# Patient Record
Sex: Female | Born: 1995
Health system: Southern US, Community
[De-identification: ages and names within clinical notes are randomized; demographics above are authoritative.]

## PROBLEM LIST (undated history)

## (undated) DIAGNOSIS — F32A Depression, unspecified: Secondary | ICD-10-CM

## (undated) DIAGNOSIS — F419 Anxiety disorder, unspecified: Secondary | ICD-10-CM

## (undated) HISTORY — PX: DENTAL SURGERY: SHX609

---
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
## 2016-03-26 DIAGNOSIS — Z23 Encounter for immunization: Secondary | ICD-10-CM | POA: Diagnosis not present

## 2016-05-11 DIAGNOSIS — F4323 Adjustment disorder with mixed anxiety and depressed mood: Secondary | ICD-10-CM | POA: Diagnosis not present

## 2016-05-23 DIAGNOSIS — F4323 Adjustment disorder with mixed anxiety and depressed mood: Secondary | ICD-10-CM | POA: Diagnosis not present

## 2016-06-26 DIAGNOSIS — K080 Exfoliation of teeth due to systemic causes: Secondary | ICD-10-CM | POA: Diagnosis not present

## 2016-07-19 DIAGNOSIS — F4323 Adjustment disorder with mixed anxiety and depressed mood: Secondary | ICD-10-CM | POA: Diagnosis not present

## 2016-07-27 DIAGNOSIS — F4323 Adjustment disorder with mixed anxiety and depressed mood: Secondary | ICD-10-CM | POA: Diagnosis not present

## 2016-08-10 DIAGNOSIS — F4323 Adjustment disorder with mixed anxiety and depressed mood: Secondary | ICD-10-CM | POA: Diagnosis not present

## 2016-08-24 DIAGNOSIS — F4323 Adjustment disorder with mixed anxiety and depressed mood: Secondary | ICD-10-CM | POA: Diagnosis not present

## 2016-09-05 ENCOUNTER — Ambulatory Visit (INDEPENDENT_AMBULATORY_CARE_PROVIDER_SITE_OTHER): Payer: Federal, State, Local not specified - PPO | Admitting: Family Medicine

## 2016-09-05 VITALS — BP 118/70 | HR 92 | Temp 98.3°F | Resp 18 | Ht 64.5 in | Wt 120.2 lb

## 2016-09-05 DIAGNOSIS — J029 Acute pharyngitis, unspecified: Secondary | ICD-10-CM

## 2016-09-05 LAB — POCT RAPID STREP A (OFFICE): Rapid Strep A Screen: NEGATIVE

## 2016-09-05 MED ORDER — PENICILLIN V POTASSIUM 500 MG PO TABS: 500.0000 mg | ORAL_TABLET | Freq: Two times a day (BID) | ORAL | 0 refills | Status: AC

## 2016-09-05 NOTE — Patient Instructions (Addendum)
   Strep Throat Strep throat is an infection of the throat. It is caused by germs. Strep throat spreads from person to person because of coughing, sneezing, or close contact. Follow these instructions at home: Medicines   Take over-the-counter and prescription medicines only as told by your doctor.  Take your antibiotic medicine as told by your doctor. Do not stop taking the medicine even if you feel better.  Have family members who also have a sore throat or fever go to a doctor. Eating and drinking   Do not share food, drinking cups, or personal items.  Try eating soft foods until your sore throat feels better.  Drink enough fluid to keep your pee (urine) clear or pale yellow. General instructions   Rinse your mouth (gargle) with a salt-water mixture 3-4 times per day or as needed. To make a salt-water mixture, stir -1 tsp of salt into 1 cup of warm water.  Make sure that all people in your house wash their hands well.  Rest.  Stay home from school or work until you have been taking antibiotics for 24 hours.  Keep all follow-up visits as told by your doctor. This is important. Contact a doctor if:  Your neck keeps getting bigger.  You get a rash, cough, or earache.  You cough up thick liquid that is green, yellow-brown, or bloody.  You have pain that does not get better with medicine.  Your problems get worse instead of getting better.  You have a fever. Get help right away if:  You throw up (vomit).  You get a very bad headache.  You neck hurts or it feels stiff.  You have chest pain or you are short of breath.  You have drooling, very bad throat pain, or changes in your voice.  Your neck is swollen or the skin gets red and tender.  Your mouth is dry or you are peeing less than normal.  You keep feeling more tired or it is hard to wake up.  Your joints are red or they hurt. This information is not intended to replace advice given to you by your health  care provider. Make sure you discuss any questions you have with your health care provider. Document Released: 11/28/2007 Document Revised: 02/08/2016 Document Reviewed: 10/04/2014 Elsevier Interactive Patient Education  2017 Elsevier Inc.    IF you received an x-ray today, you will receive an invoice from [facility]. Please contact [facility] at [phone] with questions or concerns regarding your invoice.   IF you received labwork today, you will receive an invoice from [facility]. Please contact [facility] at [phone] with questions or concerns regarding your invoice.   Our billing staff will not be able to assist you with questions regarding bills from these companies.  You will be contacted with the lab results as soon as they are available. The fastest way to get your results is to activate your My Chart account. Instructions are located on the last page of this paperwork. If you have not heard from us regarding the results in 2 weeks, please contact this office.     

## 2016-09-05 NOTE — Progress Notes (Signed)
   Laura Lambert is a 21 y.o. female who presents to Primary Care at [facility] today for:  1. Sore throat. Patient presents to clinic today with complaints of sore throat. States that sore throat started 2 days ago.Worse today. States that she looked at her throat noticed redness and white spots in the back of her throat. Also endorsing some associated swollen lymph nodes. Denies any sick contacts with strep throat. Sister about 4 months ago was diagnosed with mono. Has had strep in the past. Denies cough, fever, fatigue, nausea and vomiting.  ROS as above.  Pertinently, no chest pain, palpitations, SOB, Fever, Chills, Abd pain, N/V/D.   PMH reviewed. Patient is a nonsmoker.   No past medical history on file. No past surgical history on file.  Medications reviewed. Current Outpatient Prescriptions  Medication Sig Dispense Refill  . b complex vitamins tablet Take 1 tablet by mouth daily.    . Lactobacillus (ACIDOPHILUS PO) Take by mouth.    . norethindrone-ethinyl estradiol (FEMHRT LOW DOSE) 0.5-2.5 MG-MCG per tablet Take 1 tablet by mouth daily.     No current facility-administered medications for this visit.      Physical Exam:  BP 118/70 (BP Location: Right Arm, Patient Position: Sitting, Cuff Size: Small)   Pulse 92   Temp 98.3 F (36.8 C) (Oral)   Resp 18   Ht 5' 4.5" (1.638 m)   Wt 120 lb 3.2 oz (54.5 kg)   LMP 08/22/2016   SpO2 96%   BMI 20.31 kg/m  Gen:  Alert, cooperative patient who appears stated age in no acute distress.  Vital signs reviewed. HEENT: EOMI,  MMM, o/p with erythema. Has tonsillar exudate. Anterior cervical adenopathy appreciated bilaterally.  Pulm:  Clear to auscultation bilaterally with good air movement.  No wheezes or rales noted.   Cardiac:  Regular rate and rhythm without murmur auscultated.  Good S1/S2. Psych: mood and affect are appropriate  Results for orders placed or performed in visit on 09/05/16  POCT rapid strep A  Result Value  Ref Range   Rapid Strep A Screen Negative Negative     Assessment and Plan:  1. Sore throat Well-appearing. Centor score 3. Even though rapid strep negative will treat with penicllin due to high likelihood of strep based off symptoms. Rapid swab and can be false negative. Will send strep swab for culture. Possibility of mononucleosis in patient. Counseled patient on returning if worsening of symptoms and discontinuing medication if rash. Handout given.  - POCT rapid strep A   [name], DO 09/05/2016, 11:48 AM PGY-3, [facility]

## 2016-09-07 ENCOUNTER — Telehealth: Payer: Self-pay | Admitting: *Deleted

## 2016-09-07 DIAGNOSIS — J029 Acute pharyngitis, unspecified: Secondary | ICD-10-CM | POA: Diagnosis not present

## 2016-09-07 DIAGNOSIS — J069 Acute upper respiratory infection, unspecified: Secondary | ICD-10-CM | POA: Diagnosis not present

## 2016-09-07 NOTE — Telephone Encounter (Signed)
PATIENT STATES SHE SAW DR. Gwendolyn GrantWALDEN ON Wednesday FOR A SORE THROAT. HE SENT HER STREP CULTURE OFF TO THE LAB. SHE WOULD LIKE  TO GET THE RESULTS PLEASE. BEST PHONE [phone] (CELL) PHARMACY CHOICE IS [facility] [address] IN [place]. MBC

## 2016-09-07 NOTE — Telephone Encounter (Signed)
Spoke with patient. Advised that preliminary results show strep, not group A, and the specimen has been re-incubated for better growth. She is on PCN. She reports that her throat symptoms are worsening. Advised to RTC for re-evaluation today. She states that she will make arrangements with her PCP.

## 2016-09-08 LAB — CULTURE, GROUP A STREP

## 2016-10-02 DIAGNOSIS — F4323 Adjustment disorder with mixed anxiety and depressed mood: Secondary | ICD-10-CM | POA: Diagnosis not present

## 2016-10-19 DIAGNOSIS — F4323 Adjustment disorder with mixed anxiety and depressed mood: Secondary | ICD-10-CM | POA: Diagnosis not present

## 2016-11-01 DIAGNOSIS — F4323 Adjustment disorder with mixed anxiety and depressed mood: Secondary | ICD-10-CM | POA: Diagnosis not present

## 2017-03-08 DIAGNOSIS — R634 Abnormal weight loss: Secondary | ICD-10-CM | POA: Diagnosis not present

## 2017-03-12 DIAGNOSIS — R634 Abnormal weight loss: Secondary | ICD-10-CM | POA: Diagnosis not present

## 2017-03-12 DIAGNOSIS — R6881 Early satiety: Secondary | ICD-10-CM | POA: Diagnosis not present

## 2017-03-14 DIAGNOSIS — F422 Mixed obsessional thoughts and acts: Secondary | ICD-10-CM | POA: Diagnosis not present

## 2017-03-14 DIAGNOSIS — F411 Generalized anxiety disorder: Secondary | ICD-10-CM | POA: Diagnosis not present

## 2017-03-18 DIAGNOSIS — R6881 Early satiety: Secondary | ICD-10-CM | POA: Diagnosis not present

## 2017-03-18 DIAGNOSIS — K2930 Chronic superficial gastritis without bleeding: Secondary | ICD-10-CM | POA: Diagnosis not present

## 2017-03-18 DIAGNOSIS — F411 Generalized anxiety disorder: Secondary | ICD-10-CM | POA: Diagnosis not present

## 2017-03-18 DIAGNOSIS — R634 Abnormal weight loss: Secondary | ICD-10-CM | POA: Diagnosis not present

## 2017-03-22 ENCOUNTER — Encounter (HOSPITAL_COMMUNITY): Payer: Self-pay | Admitting: Emergency Medicine

## 2017-03-22 ENCOUNTER — Ambulatory Visit (HOSPITAL_COMMUNITY)
Admission: RE | Admit: 2017-03-22 | Discharge: 2017-03-22 | Disposition: A | Discharge status: Home / Self Care | Payer: Federal, State, Local not specified - PPO | Attending: Psychiatry | Admitting: Psychiatry

## 2017-03-22 DIAGNOSIS — F331 Major depressive disorder, recurrent, moderate: Secondary | ICD-10-CM | POA: Diagnosis not present

## 2017-03-22 NOTE — H&P (Signed)
Behavioral Health Medical Screening Exam  Laura Lambert is an 21 y.o. female.  Total Time spent with patient: 45 minutes  Psychiatric Specialty Exam: Physical Exam  Constitutional: She is oriented to person, place, and time.  Neck: Normal range of motion.  Respiratory: Effort normal and breath sounds normal.  Musculoskeletal: Normal range of motion.  Neurological: She is alert and oriented to person, place, and time.  Skin: Skin is warm and dry.  Psychiatric: Her speech is normal and behavior is normal. Judgment and thought content normal. Her mood appears anxious. Cognition and memory are normal. She exhibits a depressed mood.    Review of Systems  Constitutional: Negative.   HENT: Negative.   Eyes: Negative.   Respiratory: Negative.   Cardiovascular: Negative.   Gastrointestinal: Positive for abdominal pain, nausea and vomiting.  Genitourinary: Negative.   Musculoskeletal: Negative.   Skin: Negative.   Neurological: Negative.   Endo/Heme/Allergies: Negative.   Psychiatric/Behavioral: Positive for depression (Stable) and substance abuse (THC; stopped 3 days ago). Negative for hallucinations and suicidal ideas. The patient is nervous/anxious (Stable).     Blood pressure 110/66, pulse 80, temperature 99.1 F (37.3 C), temperature source Oral, resp. rate 16, SpO2 100 %.There is no height or weight on file to calculate BMI.  General Appearance: Casual  Eye Contact:  Minimal  Speech:  Clear and Coherent  Volume:  Normal  Mood:  Anxious and Depressed  Affect:  Depressed and Tearful  Thought Process:  Coherent  Orientation:  Full (Time, Place, and Person)  Thought Content:  Denies hallucinations, delusions, and paranoia  Suicidal Thoughts:  No  Homicidal Thoughts:  No  Memory:  Immediate;   Good Recent;   Good Remote;   Good  Judgement:  Fair  Insight:  Fair  Psychomotor Activity:  Normal  Concentration: Concentration: Fair and Attention Span: Fair  Recall:  Good   Fund of Knowledge:Fair  Language: Good  Akathisia:  No  Handed:  Right  AIMS (if indicated):     Assets:  Communication Skills Desire for Improvement Housing Physical Health Resilience Social Support Transportation  Sleep:       Musculoskeletal: Strength & Muscle Tone: within normal limits Gait & Station: normal Patient leans: N/A   Assessment:  Laura Lambert, 21 y.o., female patient presents as walk in at [facility] with complaints of worsening depression, not eating, sleeping related to withdrawal from [place].  Patient seen by this provider and consulted with Dr. Lucianne Muss.  On evaluation Laura Lambert reports that she has been having difficulty eating and recently diagnosed with cyclic vomiting syndrome.  States that she stopped smoking "weed" 3 days ago and since she stopped she has not been able to eat or sleep.  Has seen a psychiatrist who prescribed medication to increase appetite but has not taken it.  Was on Zoloft but stopped related to thinking caused headache; was on Prozac prior and no adverse reaction.   Prozac prescribed by PCP informed to restart Prozac and talk with PCP about Trazodone 50 mg Q hs prn for insomnia and Vistaril 25 mg tid prn for anxiety.  Also discussed the partial hospitalization.  Parents at side and all agree with plan to speak with PCP and partial hospitalization program.   Patient denies suicidal/homicidal ideation, psychosis, and paranoia.   Blood pressure 110/66, pulse 80, temperature 99.1 F (37.3 C), temperature source Oral, resp. rate 16, SpO2 100 %.  Recommendations:  Partial psychiatric hospitalization [facility] Partial Hospitalization:  Appointment  Wednesday 03/27/17  Based on my evaluation the patient does not appear to have an emergency medical condition. Patient advised to go to ED if no improvement with intake (related to complaints of not eating), N/V, or dehydrationand; also informed to follow up with  PCP related  medications (Prozac, Trazodone, and Vistaril).  Rankin, Shuvon, NP 03/22/2017, 11:57 AM
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
## 2017-03-25 DIAGNOSIS — K2930 Chronic superficial gastritis without bleeding: Secondary | ICD-10-CM | POA: Diagnosis not present

## 2017-03-27 ENCOUNTER — Other Ambulatory Visit (HOSPITAL_COMMUNITY): Payer: Federal, State, Local not specified - PPO | Attending: Psychiatry | Admitting: Licensed Clinical Social Worker

## 2017-03-27 ENCOUNTER — Encounter (INDEPENDENT_AMBULATORY_CARE_PROVIDER_SITE_OTHER): Payer: Self-pay

## 2017-03-27 DIAGNOSIS — F411 Generalized anxiety disorder: Secondary | ICD-10-CM | POA: Diagnosis not present

## 2017-03-28 DIAGNOSIS — G43A0 Cyclical vomiting, not intractable: Secondary | ICD-10-CM | POA: Diagnosis not present
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
## 2017-04-16 DIAGNOSIS — F411 Generalized anxiety disorder: Secondary | ICD-10-CM | POA: Diagnosis not present

## 2017-04-19 DIAGNOSIS — F411 Generalized anxiety disorder: Secondary | ICD-10-CM | POA: Diagnosis not present

## 2017-05-03 DIAGNOSIS — F411 Generalized anxiety disorder: Secondary | ICD-10-CM | POA: Diagnosis not present

## 2017-05-27 ENCOUNTER — Ambulatory Visit (HOSPITAL_COMMUNITY): Payer: Federal, State, Local not specified - PPO | Admitting: Psychiatry

## 2017-07-09 DIAGNOSIS — F411 Generalized anxiety disorder: Secondary | ICD-10-CM | POA: Diagnosis not present

## 2017-11-05 DIAGNOSIS — G4709 Other insomnia: Secondary | ICD-10-CM | POA: Diagnosis not present

## 2017-11-05 DIAGNOSIS — Z1389 Encounter for screening for other disorder: Secondary | ICD-10-CM | POA: Diagnosis not present

## 2017-11-05 DIAGNOSIS — Z681 Body mass index (BMI) 19 or less, adult: Secondary | ICD-10-CM | POA: Diagnosis not present

## 2017-12-25 DIAGNOSIS — Z681 Body mass index (BMI) 19 or less, adult: Secondary | ICD-10-CM | POA: Diagnosis not present

## 2017-12-25 DIAGNOSIS — Z118 Encounter for screening for other infectious and parasitic diseases: Secondary | ICD-10-CM | POA: Diagnosis not present

## 2017-12-25 DIAGNOSIS — Z01419 Encounter for gynecological examination (general) (routine) without abnormal findings: Secondary | ICD-10-CM | POA: Diagnosis not present

## 2017-12-25 DIAGNOSIS — Z113 Encounter for screening for infections with a predominantly sexual mode of transmission: Secondary | ICD-10-CM | POA: Diagnosis not present

## 2018-01-01 DIAGNOSIS — F411 Generalized anxiety disorder: Secondary | ICD-10-CM | POA: Diagnosis not present

## 2018-01-29 DIAGNOSIS — F1290 Cannabis use, unspecified, uncomplicated: Secondary | ICD-10-CM | POA: Diagnosis not present

## 2018-01-29 DIAGNOSIS — F411 Generalized anxiety disorder: Secondary | ICD-10-CM | POA: Diagnosis not present

## 2018-01-29 DIAGNOSIS — Z915 Personal history of self-harm: Secondary | ICD-10-CM | POA: Diagnosis not present

## 2018-01-29 DIAGNOSIS — R45851 Suicidal ideations: Secondary | ICD-10-CM | POA: Diagnosis not present

## 2018-01-29 DIAGNOSIS — F333 Major depressive disorder, recurrent, severe with psychotic symptoms: Secondary | ICD-10-CM | POA: Diagnosis not present

## 2018-01-30 DIAGNOSIS — F333 Major depressive disorder, recurrent, severe with psychotic symptoms: Secondary | ICD-10-CM | POA: Diagnosis not present

## 2018-01-30 DIAGNOSIS — F4310 Post-traumatic stress disorder, unspecified: Secondary | ICD-10-CM | POA: Diagnosis not present

## 2018-01-31 DIAGNOSIS — F333 Major depressive disorder, recurrent, severe with psychotic symptoms: Secondary | ICD-10-CM | POA: Diagnosis not present

## 2018-01-31 DIAGNOSIS — F4310 Post-traumatic stress disorder, unspecified: Secondary | ICD-10-CM | POA: Diagnosis not present

## 2018-02-01 DIAGNOSIS — F4310 Post-traumatic stress disorder, unspecified: Secondary | ICD-10-CM | POA: Diagnosis not present

## 2018-02-01 DIAGNOSIS — F333 Major depressive disorder, recurrent, severe with psychotic symptoms: Secondary | ICD-10-CM | POA: Diagnosis not present

## 2018-02-02 DIAGNOSIS — F4310 Post-traumatic stress disorder, unspecified: Secondary | ICD-10-CM | POA: Diagnosis not present

## 2018-02-02 DIAGNOSIS — F333 Major depressive disorder, recurrent, severe with psychotic symptoms: Secondary | ICD-10-CM | POA: Diagnosis not present

## 2018-02-12 DIAGNOSIS — F333 Major depressive disorder, recurrent, severe with psychotic symptoms: Secondary | ICD-10-CM | POA: Diagnosis not present

## 2018-02-19 DIAGNOSIS — F333 Major depressive disorder, recurrent, severe with psychotic symptoms: Secondary | ICD-10-CM | POA: Diagnosis not present

## 2018-02-27 DIAGNOSIS — F333 Major depressive disorder, recurrent, severe with psychotic symptoms: Secondary | ICD-10-CM | POA: Diagnosis not present

## 2018-03-04 DIAGNOSIS — F411 Generalized anxiety disorder: Secondary | ICD-10-CM | POA: Diagnosis not present

## 2018-03-05 DIAGNOSIS — F333 Major depressive disorder, recurrent, severe with psychotic symptoms: Secondary | ICD-10-CM | POA: Diagnosis not present

## 2018-03-11 DIAGNOSIS — F333 Major depressive disorder, recurrent, severe with psychotic symptoms: Secondary | ICD-10-CM | POA: Diagnosis not present

## 2018-03-17 DIAGNOSIS — F411 Generalized anxiety disorder: Secondary | ICD-10-CM | POA: Diagnosis not present

## 2018-03-19 DIAGNOSIS — F333 Major depressive disorder, recurrent, severe with psychotic symptoms: Secondary | ICD-10-CM | POA: Diagnosis not present

## 2018-03-26 DIAGNOSIS — F333 Major depressive disorder, recurrent, severe with psychotic symptoms: Secondary | ICD-10-CM | POA: Diagnosis not present

## 2018-04-09 ENCOUNTER — Encounter: Payer: Self-pay | Admitting: Emergency Medicine

## 2018-04-09 DIAGNOSIS — F4010 Social phobia, unspecified: Secondary | ICD-10-CM | POA: Insufficient documentation

## 2018-04-09 DIAGNOSIS — F333 Major depressive disorder, recurrent, severe with psychotic symptoms: Secondary | ICD-10-CM | POA: Diagnosis not present

## 2018-04-09 DIAGNOSIS — F411 Generalized anxiety disorder: Secondary | ICD-10-CM | POA: Insufficient documentation

## 2018-04-09 DIAGNOSIS — G4700 Insomnia, unspecified: Secondary | ICD-10-CM | POA: Insufficient documentation

## 2018-04-15 DIAGNOSIS — K080 Exfoliation of teeth due to systemic causes: Secondary | ICD-10-CM | POA: Diagnosis not present

## 2018-04-18 ENCOUNTER — Ambulatory Visit (INDEPENDENT_AMBULATORY_CARE_PROVIDER_SITE_OTHER): Payer: Federal, State, Local not specified - PPO | Admitting: Psychiatry

## 2018-04-18 ENCOUNTER — Encounter: Payer: Self-pay | Admitting: Psychiatry

## 2018-04-18 VITALS — BP 96/60 | HR 73

## 2018-04-18 DIAGNOSIS — F4010 Social phobia, unspecified: Secondary | ICD-10-CM | POA: Diagnosis not present

## 2018-04-18 DIAGNOSIS — F329 Major depressive disorder, single episode, unspecified: Secondary | ICD-10-CM | POA: Diagnosis not present

## 2018-04-18 DIAGNOSIS — F411 Generalized anxiety disorder: Secondary | ICD-10-CM

## 2018-04-18 DIAGNOSIS — F32A Depression, unspecified: Secondary | ICD-10-CM

## 2018-04-18 DIAGNOSIS — G4700 Insomnia, unspecified: Secondary | ICD-10-CM | POA: Diagnosis not present

## 2018-04-18 MED ORDER — SERTRALINE HCL 50 MG PO TABS: 75.0000 mg | ORAL_TABLET | Freq: Every day | ORAL | 0 refills | Status: DC

## 2018-04-18 NOTE — Progress Notes (Signed)
Laura Lambert [id number] 10/23/1995 22 y.o.  Subjective:   Patient ID:  Laura Lambert is a 22 y.o. (DOB 1995-10-19) female.  Chief Complaint:  Chief Complaint  Patient presents with  . Anxiety  . Depression  . Insomnia    HPI Laura Lambert presents to the office today for follow-up of anxiety and depression. She reports that she experiences fatigue upon awakening and it can be difficulty to get out of bed. Has noticed she can now sleep for longer periods of time. Reports anxiety has been "a lot better. A lot more manageable." Reports that she recently had to give a presentation and felt less anxious compared to the past. Denies significant worry or panic attacks. Reports that anxiety typically is related to social anxiety. Describes mood as "ok." Reports that she feels down and unmotivated at times. Notices more depression at night. Denies irritability. Occasional hopelessness. Reports motivation is good for school work and has less motivation for self care and leisure activities. Has been attending classes without difficulty. Describes energy as "ok." Reports occasionally awakening feeling fatigued. Reports that she slept 14 hours on occasion. Estimates sleeping about 8 hours most night. Denies difficulty staying asleep. Typically does not have difficulty falling asleep with Trazodone. Reports appetite has decreased slightly with Sertraline and continues to eat an adequate amount.Concentration has been adequate. Denies impulsive or risky behaviors.   Reports that she was using marijuana and stopped on Sunday, when she started experiencing extreme fatigue.   Reports occasional vague, fleeting suicidal thoughts. Denies suicidal intent or plan.   Review of Systems:  Review of Systems  Musculoskeletal: Negative for gait problem.  Neurological: Negative for tremors.  Psychiatric/Behavioral:       Please refer to HPI    Medications: I have reviewed the patient's  current medications.  Current Outpatient Medications  Medication Sig Dispense Refill  . Multiple Vitamins-Minerals (MULTIVITAMIN WITH MINERALS) tablet Take 1 tablet by mouth daily.    . norethindrone-ethinyl estradiol (FEMHRT LOW DOSE) 0.5-2.5 MG-MCG per tablet Take 1 tablet by mouth daily.    . propranolol (INDERAL) 10 MG tablet Take 10 mg by mouth 2 (two) times daily as needed.    . traZODone (DESYREL) 50 MG tablet Take 50 mg by mouth at bedtime as needed for sleep.    Marland Kitchen b complex vitamins tablet Take 1 tablet by mouth daily.    Derrill Memo ON 05/08/2018] Doxepin HCl (SILENOR) 3 MG TABS Take 3 mg by mouth at bedtime as needed.    . Lactobacillus (ACIDOPHILUS PO) Take by mouth.    . sertraline (ZOLOFT) 50 MG tablet Take 1.5 tablets (75 mg total) by mouth daily. 135 tablet 0   No current facility-administered medications for this visit.     Medication Side Effects: Appetite Suppression and Sedation  Allergies: No Known Allergies  History reviewed. No pertinent past medical history.  Family History  Problem Relation Age of Onset  . Cancer Other   . Hyperlipidemia Other   . Hypertension Other   . Diabetes Other   . Heart attack Other   . Hypertension Father   . Depression Maternal Grandmother     Social History   Socioeconomic History  . Marital status: Single    Spouse name: Not on file  . Number of children: Not on file  . Years of education: Not on file  . Highest education level: Not on file  Occupational History  . Not on file  Social Needs  . Financial  resource strain: Not on file  . Food insecurity:    Worry: Not on file    Inability: Not on file  . Transportation needs:    Medical: Not on file    Non-medical: Not on file  Tobacco Use  . Smoking status: Never Smoker  . Smokeless tobacco: Never Used  Substance and Sexual Activity  . Alcohol use: Not on file    Comment: None in last 2 weeks. Was having 1-2 drinks q evening.  . Drug use: Not Currently     Types: Marijuana    Comment: reports that she was using until 04/13/18  . Sexual activity: Not on file  Lifestyle  . Physical activity:    Days per week: Not on file    Minutes per session: Not on file  . Stress: Not on file  Relationships  . Social connections:    Talks on phone: Not on file    Gets together: Not on file    Attends religious service: Not on file    Active member of club or organization: Not on file    Attends meetings of clubs or organizations: Not on file    Relationship status: Not on file  . Intimate partner violence:    Fear of current or ex partner: Not on file    Emotionally abused: Not on file    Physically abused: Not on file    Forced sexual activity: Not on file  Other Topics Concern  . Not on file  Social History Narrative  . Not on file    Past Medical History, Surgical history, Social history, and Family history were reviewed and updated as appropriate.   Please see review of systems for further details on the patient's review from today.   Objective:   Physical Exam:  BP 96/60   Pulse 73   Physical Exam  Constitutional: She is oriented to person, place, and time. She appears well-developed. No distress.  Musculoskeletal: She exhibits no deformity.  Neurological: She is alert and oriented to person, place, and time. Coordination normal.  Psychiatric: Her speech is normal and behavior is normal. Judgment and thought content normal. Her mood appears anxious. Her affect is not angry, not blunt, not labile and not inappropriate. Cognition and memory are normal. She does not exhibit a depressed mood. She expresses no homicidal and no suicidal ideation. She expresses no suicidal plans and no homicidal plans.  Insight intact. No auditory or visual hallucinations. No delusions. Patient presents as anxious on exam, however he presents as less anxious compared to past exams.     Lab Review:     Component Value Date/Time   NA 140 11/30/2008 0640    K 3.9 11/30/2008 0640   CL 108 11/30/2008 0640   CO2 27 11/30/2008 0640   GLUCOSE 87 11/30/2008 0640   BUN 12 11/30/2008 0640   CREATININE 0.65 11/30/2008 0640   CALCIUM 9.3 11/30/2008 0640   PROT 6.3 11/30/2008 0640   PROT 6.5 11/30/2008 0640   ALBUMIN 3.9 11/30/2008 0640   ALBUMIN 3.8 11/30/2008 0640   AST 17 11/30/2008 0640   AST 17 11/30/2008 0640   ALT 12 11/30/2008 0640   ALT 11 11/30/2008 0640   ALKPHOS 96 11/30/2008 0640   ALKPHOS 96 11/30/2008 0640   BILITOT 1.0 11/30/2008 0640   BILITOT 1.2 11/30/2008 0640   GFRNONAA NOT CALCULATED 11/30/2008 0640   GFRAA  11/30/2008 0640    NOT CALCULATED  The eGFR has been calculated using the MDRD equation. This calculation has not been validated in all clinical situations. eGFR's persistently <60 mL/min signify possible Chronic Kidney Disease.       Component Value Date/Time   WBC 8.6 11/30/2008 0640   RBC 3.86 11/30/2008 0640   HGB 12.5 11/30/2008 0640   HCT 35.9 11/30/2008 0640   PLT 208 11/30/2008 0640   MCV 93.0 11/30/2008 0640   MCHC 34.8 11/30/2008 0640   RDW 11.4 11/30/2008 0640   LYMPHSABS 3.2 11/30/2008 0640   MONOABS 0.6 11/30/2008 0640   EOSABS 0.1 11/30/2008 0640   BASOSABS 0.0 11/30/2008 0640    No results found for: POCLITH, LITHIUM   No results found for: PHENYTOIN, PHENOBARB, VALPROATE, CBMZ   .res Assessment: Plan:   Patient seen for 30 minutes and greater than 50% of visit spent counseling patient regarding mood signs and symptoms and potential benefits, risk, and side effects of increasing sertraline to 75 mg daily to improve depression and residual social anxiety signs and symptoms.  Recommended trying to control signs and symptoms with one medication before adding additional medications for augmentation and patient agrees with this plan.  Discussed that she may experience some initial insomnia and possible increased jitteriness with increased dose and that this would likely resolve  after about a week.  Discussed continuing to use trazodone or insomnia Social phobia - Plan: sertraline (ZOLOFT) 50 MG tablet  Generalized anxiety disorder - Plan: sertraline (ZOLOFT) 50 MG tablet  Insomnia, unspecified type  Please see After Visit Summary for patient specific instructions.  Future Appointments  Date Time Provider [place]  05/21/2018 11:30 AM Thayer Headings, PMHNP [facility] None    No orders of the defined types were placed in this encounter.     -------------------------------

## 2018-04-23 DIAGNOSIS — F333 Major depressive disorder, recurrent, severe with psychotic symptoms: Secondary | ICD-10-CM | POA: Diagnosis not present

## 2018-04-30 DIAGNOSIS — F333 Major depressive disorder, recurrent, severe with psychotic symptoms: Secondary | ICD-10-CM | POA: Diagnosis not present

## 2018-05-07 DIAGNOSIS — F333 Major depressive disorder, recurrent, severe with psychotic symptoms: Secondary | ICD-10-CM | POA: Diagnosis not present

## 2018-05-21 ENCOUNTER — Ambulatory Visit (INDEPENDENT_AMBULATORY_CARE_PROVIDER_SITE_OTHER): Payer: Federal, State, Local not specified - PPO | Admitting: Psychiatry

## 2018-05-21 ENCOUNTER — Encounter: Payer: Self-pay | Admitting: Psychiatry

## 2018-05-21 VITALS — BP 74/52 | HR 76

## 2018-05-21 DIAGNOSIS — F4010 Social phobia, unspecified: Secondary | ICD-10-CM | POA: Diagnosis not present

## 2018-05-21 DIAGNOSIS — F329 Major depressive disorder, single episode, unspecified: Secondary | ICD-10-CM

## 2018-05-21 DIAGNOSIS — F411 Generalized anxiety disorder: Secondary | ICD-10-CM

## 2018-05-21 DIAGNOSIS — G4700 Insomnia, unspecified: Secondary | ICD-10-CM | POA: Diagnosis not present

## 2018-05-21 DIAGNOSIS — F32A Depression, unspecified: Secondary | ICD-10-CM

## 2018-05-21 MED ORDER — SERTRALINE HCL 100 MG PO TABS: 100.0000 mg | ORAL_TABLET | Freq: Every day | ORAL | 0 refills | Status: DC

## 2018-05-21 MED ORDER — TRAZODONE HCL 50 MG PO TABS: ORAL_TABLET | ORAL | 0 refills | Status: DC

## 2018-05-21 NOTE — Progress Notes (Signed)
Laura Lambert [id number] 09/01/1995 22 y.o.  Subjective:   Patient ID:  Laura Lambert is a 22 y.o. (DOB December 28, 1995) female.  Chief Complaint:  Chief Complaint  Patient presents with  . Depression  . Anxiety  . Insomnia    HPI Laura Lambert presents to the office today for follow-up of anxiety and depression. She reports that increase in Sertraline has been well tolerated and only had slight improvement. She reports that she initially noticed some slight nausea and that this resolved. "Anxiety overall has been fine" and reports that anxiety has been manageable. She reports that she continues to experience social anxiety and that it is more manageable and not interfering with functioning. Denies excessive worry or anxious thoughts. Denies panic s/s. Reports mood has been "ok. Still struggle a little bit with motivation and feeling down." Reports that she continues to have difficulty getting out of bed. Motivation and energy has been persistently low. Reports recent difficulty with sleep and having trouble with sleep initiation some nights. Reports that occasionally she has been having to take 2 Trazodone tabs and this is typically effective. Appetite has been ok. Denies concentration impairment. Denies impulsive or risky behaviors, elevated mood, or excessive energy.  Denies SI.   Reports that school has been going well.   Pt fell after visit while checking out. Pt found in floor with head against the wall. No obvious injuries noted. Pt denied any acute injuries. She reported HA since awakening this morning and denies any worsening in HA. Remained oriented and no loss of consciousness noted. VS re-checked and were 96/52 and pulse of 48. Pt monitored for over 30 minutes and was offered fluids. Pt did not drive home and had someone pick her up. Dr. Clovis Pu discussed warning signs of possible subdural hematoma and cautioned pt to stay alert for the remainder of the day and to  seek urgent medical attention if she develops n/v, slurred speech, severe HA, and/or confusion. Pt advised not to take Propranolol today and to stay hydrated.  Past medication trials: Sertraline Prozac Trazodone Remeron Hydroxyzine Seroquel Propanolol  Review of Systems:  Review of Systems  Constitutional: Positive for fatigue.  HENT: Positive for congestion and sore throat.        Currently has cold s/s.   Neurological: Positive for headaches.    Medications: I have reviewed the patient's current medications.  Current Outpatient Medications  Medication Sig Dispense Refill  . Multiple Vitamins-Minerals (MULTIVITAMIN WITH MINERALS) tablet Take 1 tablet by mouth daily.    . norethindrone-ethinyl estradiol (FEMHRT LOW DOSE) 0.5-2.5 MG-MCG per tablet Take 1 tablet by mouth daily.    . propranolol (INDERAL) 10 MG tablet Take 10 mg by mouth 2 (two) times daily as needed.    Marland Kitchen b complex vitamins tablet Take 1 tablet by mouth daily.    . Lactobacillus (ACIDOPHILUS PO) Take by mouth.    . sertraline (ZOLOFT) 100 MG tablet Take 1 tablet (100 mg total) by mouth daily. 90 tablet 0  . traZODone (DESYREL) 50 MG tablet Take 1-2 tabs po QHS prn insomnia 180 tablet 0   No current facility-administered medications for this visit.     Medication Side Effects: None  Allergies: No Known Allergies  History reviewed. No pertinent past medical history.  Family History  Problem Relation Age of Onset  . Cancer Other   . Hyperlipidemia Other   . Hypertension Other   . Diabetes Other   . Heart attack Other   .  Hypertension Father   . Depression Maternal Grandmother     Social History   Socioeconomic History  . Marital status: Single    Spouse name: Not on file  . Number of children: Not on file  . Years of education: Not on file  . Highest education level: Not on file  Occupational History  . Not on file  Social Needs  . Financial resource strain: Not on file  . Food insecurity:     Worry: Not on file    Inability: Not on file  . Transportation needs:    Medical: Not on file    Non-medical: Not on file  Tobacco Use  . Smoking status: Never Smoker  . Smokeless tobacco: Never Used  Substance and Sexual Activity  . Alcohol use: Yes    Comment: Occ  . Drug use: Yes    Types: Marijuana    Comment: reports that she was using until 04/13/18  . Sexual activity: Not on file  Lifestyle  . Physical activity:    Days per week: Not on file    Minutes per session: Not on file  . Stress: Not on file  Relationships  . Social connections:    Talks on phone: Not on file    Gets together: Not on file    Attends religious service: Not on file    Active member of club or organization: Not on file    Attends meetings of clubs or organizations: Not on file    Relationship status: Not on file  . Intimate partner violence:    Fear of current or ex partner: Not on file    Emotionally abused: Not on file    Physically abused: Not on file    Forced sexual activity: Not on file  Other Topics Concern  . Not on file  Social History Narrative  . Not on file    Past Medical History, Surgical history, Social history, and Family history were reviewed and updated as appropriate.   Please see review of systems for further details on the patient's review from today.   Objective:   Physical Exam:  BP (!) 74/52   Pulse 76   Physical Exam  Constitutional: She is oriented to person, place, and time. She appears well-developed. No distress.  Musculoskeletal: She exhibits no deformity.  Neurological: She is alert and oriented to person, place, and time. Coordination normal.  Psychiatric: Her speech is normal. Judgment and thought content normal. Her mood appears not anxious. Her affect is not angry, not blunt, not labile and not inappropriate. She is slowed. Cognition and memory are normal. She exhibits a depressed mood. She expresses no homicidal and no suicidal ideation. She  expresses no suicidal plans and no homicidal plans.  Insight intact. No auditory or visual hallucinations. No delusions.   Vitals reviewed.   Lab Review:     Component Value Date/Time   NA 140 11/30/2008 0640   K 3.9 11/30/2008 0640   CL 108 11/30/2008 0640   CO2 27 11/30/2008 0640   GLUCOSE 87 11/30/2008 0640   BUN 12 11/30/2008 0640   CREATININE 0.65 11/30/2008 0640   CALCIUM 9.3 11/30/2008 0640   PROT 6.3 11/30/2008 0640   PROT 6.5 11/30/2008 0640   ALBUMIN 3.9 11/30/2008 0640   ALBUMIN 3.8 11/30/2008 0640   AST 17 11/30/2008 0640   AST 17 11/30/2008 0640   ALT 12 11/30/2008 0640   ALT 11 11/30/2008 0640   ALKPHOS 96 11/30/2008 0640  ALKPHOS 96 11/30/2008 0640   BILITOT 1.0 11/30/2008 0640   BILITOT 1.2 11/30/2008 0640   GFRNONAA NOT CALCULATED 11/30/2008 0640   GFRAA  11/30/2008 0640    NOT CALCULATED        The eGFR has been calculated using the MDRD equation. This calculation has not been validated in all clinical situations. eGFR's persistently <60 mL/min signify possible Chronic Kidney Disease.       Component Value Date/Time   WBC 8.6 11/30/2008 0640   RBC 3.86 11/30/2008 0640   HGB 12.5 11/30/2008 0640   HCT 35.9 11/30/2008 0640   PLT 208 11/30/2008 0640   MCV 93.0 11/30/2008 0640   MCHC 34.8 11/30/2008 0640   RDW 11.4 11/30/2008 0640   LYMPHSABS 3.2 11/30/2008 0640   MONOABS 0.6 11/30/2008 0640   EOSABS 0.1 11/30/2008 0640   BASOSABS 0.0 11/30/2008 0640    No results found for: POCLITH, LITHIUM   No results found for: PHENYTOIN, PHENOBARB, VALPROATE, CBMZ   .res Assessment: Plan:   Will increase sertraline 200 mg daily to improve depressive signs and symptoms.  Discussed possible augmentation options, such as adding Wellbutrin, however patient reports that she would prefer to try increase in sertraline first. Will increase trazodone to 50-100 mg po QHS for insomnia. Will continue propanolol for anxiety. Depression, unspecified depression  type - Plan: sertraline (ZOLOFT) 100 MG tablet  Social phobia - Plan: sertraline (ZOLOFT) 100 MG tablet  Generalized anxiety disorder - Plan: sertraline (ZOLOFT) 100 MG tablet  Insomnia, unspecified type - Plan: traZODone (DESYREL) 50 MG tablet  Please see After Visit Summary for patient specific instructions.  Future Appointments  Date Time Provider [place]  09/10/2018  9:30 AM Thayer Headings, PMHNP CP-CP None    No orders of the defined types were placed in this encounter.     -------------------------------

## 2018-07-22 DIAGNOSIS — F333 Major depressive disorder, recurrent, severe with psychotic symptoms: Secondary | ICD-10-CM | POA: Diagnosis not present

## 2018-07-28 ENCOUNTER — Other Ambulatory Visit: Payer: Self-pay

## 2018-07-28 DIAGNOSIS — F4010 Social phobia, unspecified: Secondary | ICD-10-CM

## 2018-07-28 DIAGNOSIS — F32A Depression, unspecified: Secondary | ICD-10-CM

## 2018-07-28 DIAGNOSIS — F329 Major depressive disorder, single episode, unspecified: Secondary | ICD-10-CM

## 2018-07-28 DIAGNOSIS — F411 Generalized anxiety disorder: Secondary | ICD-10-CM

## 2018-07-28 MED ORDER — SERTRALINE HCL 100 MG PO TABS: 100.0000 mg | ORAL_TABLET | Freq: Every day | ORAL | 0 refills | Status: DC

## 2018-08-04 DIAGNOSIS — F333 Major depressive disorder, recurrent, severe with psychotic symptoms: Secondary | ICD-10-CM | POA: Diagnosis not present

## 2018-08-18 DIAGNOSIS — F333 Major depressive disorder, recurrent, severe with psychotic symptoms: Secondary | ICD-10-CM | POA: Diagnosis not present

## 2018-09-01 DIAGNOSIS — F333 Major depressive disorder, recurrent, severe with psychotic symptoms: Secondary | ICD-10-CM | POA: Diagnosis not present

## 2018-09-10 ENCOUNTER — Ambulatory Visit: Payer: Federal, State, Local not specified - PPO | Admitting: Psychiatry

## 2018-09-22 ENCOUNTER — Ambulatory Visit: Payer: Federal, State, Local not specified - PPO | Admitting: Psychiatry

## 2018-11-05 DIAGNOSIS — G4700 Insomnia, unspecified: Secondary | ICD-10-CM | POA: Diagnosis not present

## 2018-11-05 DIAGNOSIS — Z1331 Encounter for screening for depression: Secondary | ICD-10-CM | POA: Diagnosis not present

## 2018-11-05 DIAGNOSIS — F39 Unspecified mood [affective] disorder: Secondary | ICD-10-CM | POA: Diagnosis not present

## 2019-03-10 DIAGNOSIS — Z79899 Other long term (current) drug therapy: Secondary | ICD-10-CM | POA: Diagnosis not present

## 2019-03-10 DIAGNOSIS — G4700 Insomnia, unspecified: Secondary | ICD-10-CM | POA: Diagnosis not present

## 2019-03-10 DIAGNOSIS — F29 Unspecified psychosis not due to a substance or known physiological condition: Secondary | ICD-10-CM | POA: Diagnosis not present

## 2019-03-10 DIAGNOSIS — F418 Other specified anxiety disorders: Secondary | ICD-10-CM | POA: Diagnosis not present

## 2019-03-10 DIAGNOSIS — F339 Major depressive disorder, recurrent, unspecified: Secondary | ICD-10-CM | POA: Diagnosis not present

## 2019-03-10 DIAGNOSIS — F19159 Other psychoactive substance abuse with psychoactive substance-induced psychotic disorder, unspecified: Secondary | ICD-10-CM | POA: Diagnosis not present

## 2019-03-10 DIAGNOSIS — Z20828 Contact with and (suspected) exposure to other viral communicable diseases: Secondary | ICD-10-CM | POA: Diagnosis not present

## 2019-03-10 DIAGNOSIS — R45851 Suicidal ideations: Secondary | ICD-10-CM | POA: Diagnosis not present

## 2019-03-10 DIAGNOSIS — Z915 Personal history of self-harm: Secondary | ICD-10-CM | POA: Diagnosis not present

## 2019-03-10 DIAGNOSIS — F341 Dysthymic disorder: Secondary | ICD-10-CM | POA: Diagnosis not present

## 2019-03-10 DIAGNOSIS — F603 Borderline personality disorder: Secondary | ICD-10-CM | POA: Diagnosis not present

## 2019-03-11 DIAGNOSIS — F29 Unspecified psychosis not due to a substance or known physiological condition: Secondary | ICD-10-CM | POA: Diagnosis not present

## 2019-03-12 DIAGNOSIS — F29 Unspecified psychosis not due to a substance or known physiological condition: Secondary | ICD-10-CM | POA: Diagnosis not present

## 2019-03-13 DIAGNOSIS — F29 Unspecified psychosis not due to a substance or known physiological condition: Secondary | ICD-10-CM | POA: Diagnosis not present

## 2019-03-14 DIAGNOSIS — F29 Unspecified psychosis not due to a substance or known physiological condition: Secondary | ICD-10-CM | POA: Diagnosis not present

## 2019-03-15 DIAGNOSIS — F29 Unspecified psychosis not due to a substance or known physiological condition: Secondary | ICD-10-CM | POA: Diagnosis not present

## 2019-03-16 DIAGNOSIS — F29 Unspecified psychosis not due to a substance or known physiological condition: Secondary | ICD-10-CM | POA: Diagnosis not present

## 2019-03-17 DIAGNOSIS — F29 Unspecified psychosis not due to a substance or known physiological condition: Secondary | ICD-10-CM | POA: Diagnosis not present

## 2019-03-18 DIAGNOSIS — F29 Unspecified psychosis not due to a substance or known physiological condition: Secondary | ICD-10-CM | POA: Diagnosis not present

## 2019-03-19 DIAGNOSIS — F29 Unspecified psychosis not due to a substance or known physiological condition: Secondary | ICD-10-CM | POA: Diagnosis not present

## 2019-03-24 DIAGNOSIS — F329 Major depressive disorder, single episode, unspecified: Secondary | ICD-10-CM | POA: Diagnosis not present

## 2019-03-24 DIAGNOSIS — R45851 Suicidal ideations: Secondary | ICD-10-CM | POA: Diagnosis not present

## 2019-03-24 DIAGNOSIS — R456 Violent behavior: Secondary | ICD-10-CM | POA: Diagnosis not present

## 2019-03-24 DIAGNOSIS — F911 Conduct disorder, childhood-onset type: Secondary | ICD-10-CM | POA: Diagnosis not present

## 2019-03-24 DIAGNOSIS — Z79899 Other long term (current) drug therapy: Secondary | ICD-10-CM | POA: Diagnosis not present

## 2019-03-24 DIAGNOSIS — Z20828 Contact with and (suspected) exposure to other viral communicable diseases: Secondary | ICD-10-CM | POA: Diagnosis not present

## 2019-03-25 DIAGNOSIS — F333 Major depressive disorder, recurrent, severe with psychotic symptoms: Secondary | ICD-10-CM | POA: Diagnosis not present

## 2019-03-26 DIAGNOSIS — F419 Anxiety disorder, unspecified: Secondary | ICD-10-CM | POA: Diagnosis not present

## 2019-03-26 DIAGNOSIS — F200 Paranoid schizophrenia: Secondary | ICD-10-CM | POA: Diagnosis not present

## 2019-03-26 DIAGNOSIS — Z915 Personal history of self-harm: Secondary | ICD-10-CM | POA: Diagnosis not present

## 2019-03-26 DIAGNOSIS — Z91410 Personal history of adult physical and sexual abuse: Secondary | ICD-10-CM | POA: Diagnosis not present

## 2019-03-26 DIAGNOSIS — R45851 Suicidal ideations: Secondary | ICD-10-CM | POA: Diagnosis not present

## 2019-03-26 DIAGNOSIS — Z62810 Personal history of physical and sexual abuse in childhood: Secondary | ICD-10-CM | POA: Diagnosis not present

## 2019-04-01 ENCOUNTER — Telehealth: Payer: Self-pay | Admitting: Psychiatry

## 2019-04-01 NOTE — Telephone Encounter (Signed)
Patient's father Fritz Pickerel left vm today @10 :68 stating patient has been hospitalized at [facility] and he would like for you to conduct a genetic drug test for patient

## 2019-04-02 ENCOUNTER — Telehealth: Payer: Self-pay | Admitting: Psychiatry

## 2019-04-02 NOTE — Telephone Encounter (Signed)
Pt dad Fritz Pickerel called to advise in [place] 10/12 appt. Ask if you could reach out to Dr.Lawrence Dunn there.

## 2019-04-03 NOTE — Telephone Encounter (Signed)
Release signed 2018 for all [place] for mom & dad. Will that work?

## 2019-04-06 ENCOUNTER — Ambulatory Visit: Payer: Federal, State, Local not specified - PPO | Admitting: Psychiatry

## 2019-04-15 DIAGNOSIS — F411 Generalized anxiety disorder: Secondary | ICD-10-CM | POA: Diagnosis not present

## 2019-04-20 DIAGNOSIS — F2081 Schizophreniform disorder: Secondary | ICD-10-CM | POA: Diagnosis not present

## 2019-04-22 DIAGNOSIS — F603 Borderline personality disorder: Secondary | ICD-10-CM | POA: Diagnosis not present

## 2019-04-27 DIAGNOSIS — F333 Major depressive disorder, recurrent, severe with psychotic symptoms: Secondary | ICD-10-CM | POA: Diagnosis not present

## 2019-04-29 DIAGNOSIS — F603 Borderline personality disorder: Secondary | ICD-10-CM | POA: Diagnosis not present

## 2019-05-04 DIAGNOSIS — F2081 Schizophreniform disorder: Secondary | ICD-10-CM | POA: Diagnosis not present

## 2019-05-05 DIAGNOSIS — F603 Borderline personality disorder: Secondary | ICD-10-CM | POA: Diagnosis not present

## 2019-05-08 DIAGNOSIS — F603 Borderline personality disorder: Secondary | ICD-10-CM | POA: Diagnosis not present

## 2019-05-12 DIAGNOSIS — F603 Borderline personality disorder: Secondary | ICD-10-CM | POA: Diagnosis not present

## 2019-05-15 DIAGNOSIS — S0501XA Injury of conjunctiva and corneal abrasion without foreign body, right eye, initial encounter: Secondary | ICD-10-CM | POA: Diagnosis not present

## 2019-05-15 DIAGNOSIS — T1501XA Foreign body in cornea, right eye, initial encounter: Secondary | ICD-10-CM | POA: Diagnosis not present

## 2019-05-15 DIAGNOSIS — H5711 Ocular pain, right eye: Secondary | ICD-10-CM | POA: Diagnosis not present

## 2019-05-22 DIAGNOSIS — F603 Borderline personality disorder: Secondary | ICD-10-CM | POA: Diagnosis not present

## 2019-05-27 DIAGNOSIS — F603 Borderline personality disorder: Secondary | ICD-10-CM | POA: Diagnosis not present

## 2019-06-01 DIAGNOSIS — F2081 Schizophreniform disorder: Secondary | ICD-10-CM | POA: Diagnosis not present

## 2019-06-02 DIAGNOSIS — F603 Borderline personality disorder: Secondary | ICD-10-CM | POA: Diagnosis not present

## 2019-06-12 DIAGNOSIS — F4323 Adjustment disorder with mixed anxiety and depressed mood: Secondary | ICD-10-CM | POA: Diagnosis not present

## 2019-06-16 DIAGNOSIS — F603 Borderline personality disorder: Secondary | ICD-10-CM | POA: Diagnosis not present

## 2019-06-23 DIAGNOSIS — F603 Borderline personality disorder: Secondary | ICD-10-CM | POA: Diagnosis not present

## 2019-06-23 DIAGNOSIS — F2081 Schizophreniform disorder: Secondary | ICD-10-CM | POA: Diagnosis not present

## 2019-06-25 DIAGNOSIS — F4323 Adjustment disorder with mixed anxiety and depressed mood: Secondary | ICD-10-CM | POA: Diagnosis not present

## 2019-06-30 DIAGNOSIS — F603 Borderline personality disorder: Secondary | ICD-10-CM | POA: Diagnosis not present

## 2019-07-01 DIAGNOSIS — Z01419 Encounter for gynecological examination (general) (routine) without abnormal findings: Secondary | ICD-10-CM | POA: Diagnosis not present

## 2019-07-01 DIAGNOSIS — Z118 Encounter for screening for other infectious and parasitic diseases: Secondary | ICD-10-CM | POA: Diagnosis not present

## 2019-07-01 DIAGNOSIS — Z6822 Body mass index (BMI) 22.0-22.9, adult: Secondary | ICD-10-CM | POA: Diagnosis not present

## 2019-07-02 DIAGNOSIS — F4323 Adjustment disorder with mixed anxiety and depressed mood: Secondary | ICD-10-CM | POA: Diagnosis not present

## 2019-07-07 DIAGNOSIS — F603 Borderline personality disorder: Secondary | ICD-10-CM | POA: Diagnosis not present

## 2019-07-09 DIAGNOSIS — F4323 Adjustment disorder with mixed anxiety and depressed mood: Secondary | ICD-10-CM | POA: Diagnosis not present

## 2019-07-15 DIAGNOSIS — F603 Borderline personality disorder: Secondary | ICD-10-CM | POA: Diagnosis not present

## 2019-07-28 DIAGNOSIS — F603 Borderline personality disorder: Secondary | ICD-10-CM | POA: Diagnosis not present

## 2019-08-18 DIAGNOSIS — F603 Borderline personality disorder: Secondary | ICD-10-CM | POA: Diagnosis not present

## 2019-09-15 DIAGNOSIS — F603 Borderline personality disorder: Secondary | ICD-10-CM | POA: Diagnosis not present

## 2019-10-23 DIAGNOSIS — F2081 Schizophreniform disorder: Secondary | ICD-10-CM | POA: Diagnosis not present

## 2019-11-11 ENCOUNTER — Emergency Department (HOSPITAL_COMMUNITY)
Admission: EM | Admit: 2019-11-11 | Discharge: 2019-11-12 | Disposition: A | Discharge status: Other Acute Inpatient Hospital | Payer: Federal, State, Local not specified - PPO | Source: Home / Self Care | Attending: Emergency Medicine | Admitting: Emergency Medicine

## 2019-11-11 ENCOUNTER — Encounter (HOSPITAL_COMMUNITY): Payer: Self-pay

## 2019-11-11 ENCOUNTER — Other Ambulatory Visit: Payer: Self-pay

## 2019-11-11 DIAGNOSIS — Z03818 Encounter for observation for suspected exposure to other biological agents ruled out: Secondary | ICD-10-CM | POA: Diagnosis not present

## 2019-11-11 DIAGNOSIS — F1910 Other psychoactive substance abuse, uncomplicated: Secondary | ICD-10-CM

## 2019-11-11 DIAGNOSIS — T50902A Poisoning by unspecified drugs, medicaments and biological substances, intentional self-harm, initial encounter: Secondary | ICD-10-CM

## 2019-11-11 DIAGNOSIS — Z046 Encounter for general psychiatric examination, requested by authority: Secondary | ICD-10-CM | POA: Insufficient documentation

## 2019-11-11 DIAGNOSIS — R45851 Suicidal ideations: Secondary | ICD-10-CM | POA: Insufficient documentation

## 2019-11-11 DIAGNOSIS — T424X2A Poisoning by benzodiazepines, intentional self-harm, initial encounter: Secondary | ICD-10-CM | POA: Diagnosis not present

## 2019-11-11 DIAGNOSIS — Z79899 Other long term (current) drug therapy: Secondary | ICD-10-CM | POA: Insufficient documentation

## 2019-11-11 DIAGNOSIS — F332 Major depressive disorder, recurrent severe without psychotic features: Secondary | ICD-10-CM | POA: Insufficient documentation

## 2019-11-11 DIAGNOSIS — Z20822 Contact with and (suspected) exposure to covid-19: Secondary | ICD-10-CM | POA: Insufficient documentation

## 2019-11-11 DIAGNOSIS — Z7289 Other problems related to lifestyle: Secondary | ICD-10-CM | POA: Insufficient documentation

## 2019-11-11 LAB — COMPREHENSIVE METABOLIC PANEL
ALT: 13 U/L (ref 0–44)
AST: 14 U/L — ABNORMAL LOW (ref 15–41)
Albumin: 3.8 g/dL (ref 3.5–5.0)
Alkaline Phosphatase: 56 U/L (ref 38–126)
Anion gap: 7 (ref 5–15)
BUN: 14 mg/dL (ref 6–20)
CO2: 26 mmol/L (ref 22–32)
Calcium: 8.8 mg/dL — ABNORMAL LOW (ref 8.9–10.3)
Chloride: 108 mmol/L (ref 98–111)
Creatinine, Ser: 0.71 mg/dL (ref 0.44–1.00)
GFR calc Af Amer: >60 mL/min (ref >60)
GFR calc non Af Amer: >60 mL/min (ref >60)
Glucose, Bld: 90 mg/dL (ref 70–99)
Potassium: 3.7 mmol/L (ref 3.5–5.1)
Sodium: 141 mmol/L (ref 135–145)
Total Bilirubin: 0.4 mg/dL (ref 0.3–1.2)
Total Protein: 6.8 g/dL (ref 6.5–8.1)

## 2019-11-11 LAB — RAPID URINE DRUG SCREEN, HOSP PERFORMED
Amphetamines: NOT DETECTED
Barbiturates: NOT DETECTED
Benzodiazepines: POSITIVE — AB
Cocaine: NOT DETECTED
Opiates: NOT DETECTED
Tetrahydrocannabinol: POSITIVE — AB

## 2019-11-11 LAB — I-STAT BETA HCG BLOOD, ED (MC, WL, AP ONLY): I-stat hCG, quantitative: <5 m[IU]/mL (ref <5)

## 2019-11-11 LAB — CBC
HCT: 38.6 % (ref 36.0–46.0)
Hemoglobin: 13 g/dL (ref 12.0–15.0)
MCH: 32.3 pg (ref 26.0–34.0)
MCHC: 33.7 g/dL (ref 30.0–36.0)
MCV: 95.8 fL (ref 80.0–100.0)
Platelets: 286 10*3/uL (ref 150–400)
RBC: 4.03 MIL/uL (ref 3.87–5.11)
RDW: 11.9 % (ref 11.5–15.5)
WBC: 7.4 10*3/uL (ref 4.0–10.5)
nRBC: 0 % (ref 0.0–0.2)

## 2019-11-11 LAB — ETHANOL: Alcohol, Ethyl (B): <10 mg/dL (ref <10)

## 2019-11-11 LAB — ACETAMINOPHEN LEVEL
Acetaminophen (Tylenol), Serum: <10 ug/mL — ABNORMAL LOW (ref 10–30)
Acetaminophen (Tylenol), Serum: <10 ug/mL — ABNORMAL LOW (ref 10–30)

## 2019-11-11 LAB — SALICYLATE LEVEL: Salicylate Lvl: <7 mg/dL — ABNORMAL LOW (ref 7.0–30.0)

## 2019-11-11 MED ORDER — SODIUM CHLORIDE 0.9 % IV BOLUS
1000.0000 mL | Freq: Once | INTRAVENOUS | Status: AC
  Administered 2019-11-11: 1000 mL via INTRAVENOUS

## 2019-11-11 NOTE — ED Provider Notes (Signed)
[facility]   CSN: [id number] Arrival date & time: 11/11/19  1941     History Chief Complaint  Patient presents with  . Suicide Attempt  . Drug Overdose    Laura Lambert is a 24 y.o. female with a past medical history of anxiety presenting to the ED with a chief complaint of drug overdose.  Approximately 1 to 1-1/2 hours ago (around 7 or 7:30PM) patient took an unknown amount of clonazepam.  She is unsure of the dosage or the amount of pills that she took.  She was prescribed this medication to be taken as needed.  She states on a regular basis she will take it not even once a month.  She did this and attempt to harm herself.  She got a call from a new job today saying that she failed a drug test and was positive for marijuana.  She reports history of suicide attempts in the past as a teenager.  She denies any other medication ingestions.  She states that she feels sleepy right now but denies any other symptoms.  Denies any vomiting, chest pain, abdominal pain, tremors.  She denies daily alcohol use or other drug use.  HPI     History reviewed. No pertinent past medical history.  Patient Active Problem List   Diagnosis Date Noted  . Social phobia 04/09/2018  . Generalized anxiety disorder 04/09/2018  . Insomnia 04/09/2018    Past Surgical History:  Procedure Laterality Date  . DENTAL SURGERY       OB History   No obstetric history on file.     Family History  Problem Relation Age of Onset  . Cancer Other   . Hyperlipidemia Other   . Hypertension Other   . Diabetes Other   . Heart attack Other   . Hypertension Father   . Depression Maternal Grandmother     Social History   Tobacco Use  . Smoking status: Never Smoker  . Smokeless tobacco: Never Used  Substance Use Topics  . Alcohol use: Yes    Comment: Occ  . Drug use: Yes    Types: Marijuana    Home Medications Prior to Admission medications     Medication Sig Start Date End Date Taking? Authorizing Provider  ARIPiprazole (ABILIFY) 20 MG tablet Take 20 mg by mouth every evening.  10/23/19  Yes [provider]  clonazePAM (KLONOPIN) 0.5 MG tablet Take 0.5 mg by mouth once.   Yes [provider]  LESSINA-28 0.1-20 MG-MCG tablet Take 1 tablet by mouth daily. 10/29/19  Yes [provider]  Multiple Vitamins-Minerals (MULTIVITAMIN WITH MINERALS) tablet Take 1 tablet by mouth daily.   Yes [provider]  sertraline (ZOLOFT) 50 MG tablet Take 125 mg by mouth at bedtime.  10/23/19  Yes [provider]  traZODone (DESYREL) 50 MG tablet Take 1-2 tabs po QHS prn insomnia Patient taking differently: Take 50 mg by mouth at bedtime as needed for sleep.  05/21/18  Yes Corie Chiquito, PMHNP  sertraline (ZOLOFT) 100 MG tablet Take 1 tablet (100 mg total) by mouth daily. 07/28/18 10/26/18  Corie Chiquito, PMHNP    Allergies    Patient has no known allergies.  Review of Systems   Review of Systems  Constitutional: Negative for appetite change, chills and fever.  HENT: Negative for ear pain, rhinorrhea, sneezing and sore throat.   Eyes: Negative for photophobia and visual disturbance.  Respiratory: Negative for cough, chest tightness, shortness  of breath and wheezing.   Cardiovascular: Negative for chest pain and palpitations.  Gastrointestinal: Negative for abdominal pain, blood in stool, constipation, diarrhea, nausea and vomiting.  Genitourinary: Negative for dysuria, hematuria and urgency.  Musculoskeletal: Negative for myalgias.  Skin: Negative for rash.  Neurological: Negative for dizziness, weakness and light-headedness.  Psychiatric/Behavioral: Positive for dysphoric mood, self-injury and suicidal ideas.    Physical Exam Updated Vital Signs BP (!) 103/54   Pulse (!) 55   Temp 98 F (36.7 C)   Resp (!) 25   Ht 5\' 6"  (1.676 m)   Wt 65.8 kg   SpO2 99%   BMI 23.40 kg/m   Physical  Exam Vitals and nursing note reviewed.  Constitutional:      General: She is not in acute distress.    Appearance: She is well-developed.     Comments: No tremor noted.  HENT:     Head: Normocephalic and atraumatic.     Nose: Nose normal.  Eyes:     General: No scleral icterus.       Right eye: No discharge.        Left eye: No discharge.     Conjunctiva/sclera: Conjunctivae normal.     Pupils: Pupils are equal, round, and reactive to light.  Cardiovascular:     Rate and Rhythm: Normal rate and regular rhythm.     Heart sounds: Normal heart sounds. No murmur. No friction rub. No gallop.   Pulmonary:     Effort: Pulmonary effort is normal. No respiratory distress.     Breath sounds: Normal breath sounds.  Abdominal:     General: Bowel sounds are normal. There is no distension.     Palpations: Abdomen is soft.     Tenderness: There is no abdominal tenderness. There is no guarding.  Musculoskeletal:        General: Normal range of motion.     Cervical back: Normal range of motion and neck supple.  Skin:    General: Skin is warm and dry.     Findings: No rash.  Neurological:     General: No focal deficit present.     Mental Status: She is alert and oriented to person, place, and time.     Cranial Nerves: No cranial nerve deficit.     Sensory: No sensory deficit.     Motor: No weakness or abnormal muscle tone.     Coordination: Coordination normal.     ED Results / Procedures / Treatments   Labs (all labs ordered are listed, but only abnormal results are displayed) Labs Reviewed  COMPREHENSIVE METABOLIC PANEL - Abnormal; Notable for the following components:      Result Value   Calcium 8.8 (*)    AST 14 (*)    All other components within normal limits  SALICYLATE LEVEL - Abnormal; Notable for the following components:   Salicylate Lvl <7.0 (*)    All other components within normal limits  ACETAMINOPHEN LEVEL - Abnormal; Notable for the following components:    Acetaminophen (Tylenol), Serum <10 (*)    All other components within normal limits  RAPID URINE DRUG SCREEN, HOSP PERFORMED - Abnormal; Notable for the following components:   Benzodiazepines POSITIVE (*)    Tetrahydrocannabinol POSITIVE (*)    All other components within normal limits  ACETAMINOPHEN LEVEL - Abnormal; Notable for the following components:   Acetaminophen (Tylenol), Serum <10 (*)    All other components within normal limits  SARS CORONAVIRUS 2 BY RT  PCR ([facility])  ETHANOL  CBC  I-STAT BETA HCG BLOOD, ED ([facility], [facility], [facility] ONLY)    EKG EKG Interpretation  Date/Time:  Wednesday Nov 11 2019 19:57:13 EDT Ventricular Rate:  67 PR Interval:    QRS Duration: 79 QT Interval:  416 QTC Calculation: 440 R Axis:   81 Text Interpretation: Sinus rhythm No previous ECGs available Confirmed by Wandra Arthurs [phone]) on 11/11/2019 8:57:07 PM   Radiology No results found.  Procedures Procedures (including critical care time)  Medications Ordered in ED Medications - No data to display  ED Course  I have reviewed the triage vital signs and the nursing notes.  Pertinent labs & imaging results that were available during my care of the patient were reviewed by me and considered in my medical decision making (see chart for details).  Clinical Course as of Nov 10 2248  Wed Nov 11, 2019  2106 Poison control says we need a second tylenol level around 9:30 and that as long as she is back to baseline 6 hours after taking the clonazepam she should be medically cleared   [HK]    Clinical Course User Index [HK] Delia Heady, PA-C   MDM Rules/Calculators/A&P                      24 year old female presenting to the ED after drug overdose.  At approximately 7 to 7:30 PM she took an unknown amount of clonazepam 0.5 mg tablets in attempt to harm herself.  States that she feels a drug test at work and attempted to kill herself by overdosing.  She  does not take benzodiazepines on a daily basis, states that she will take it once every few months.  She reports that she feels sleepy but denies any tremors, seizures, vomiting, abdominal pain or chest pain.  She is alert and oriented x4 on my exam.  Vital signs are within normal limits.  Initial lab work including Tylenol levels, salicylate level, CMP, hCG, ethanol and CBC unremarkable.  EKG shows normal sinus rhythm.  Per poison control recommendations, repeat Tylenol level will need to be obtained.  This was normal as well.  Patient will need to be observed will be medically cleared if she remains asymptomatic at 1:30 AM. TTS recommending inpatient treatment and they are seeking placement.   Portions of this note were generated with Lobbyist. Dictation errors may occur despite best attempts at proofreading.  Final Clinical Impression(s) / ED Diagnoses Final diagnoses:  Suicidal ideation  Intentional drug overdose, initial encounter [facility])  Polysubstance abuse [facility])    Rx / DC Orders ED Discharge Orders    None       Delia Heady, PA-C 11/11/19 2251    Drenda Freeze, MD 11/11/19 [phone]

## 2019-11-11 NOTE — ED Notes (Signed)
Poison control recommends a second a secondary blood tylenol level at 2130. Poison control states that she will be cleared 6 hours after taking the klonapin if she is back to her baseline.

## 2019-11-11 NOTE — ED Triage Notes (Signed)
Pt sts she took an entire bottle of Klonopin. Pt sts she ingested unknown amount and unknown dosage at 1730 tonight after failing a drug screen for a job.

## 2019-11-11 NOTE — ED Notes (Signed)
Poison control has medically cleared the pt
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
## 2019-11-12 ENCOUNTER — Encounter (HOSPITAL_COMMUNITY): Payer: Self-pay | Admitting: Behavioral Health

## 2019-11-12 ENCOUNTER — Inpatient Hospital Stay (HOSPITAL_COMMUNITY)
Admission: AD | Admit: 2019-11-12 | Discharge: 2019-11-13 | DRG: 885 | Disposition: A | Discharge status: Home / Self Care | Payer: Federal, State, Local not specified - PPO | Source: Intra-hospital | Attending: Psychiatry | Admitting: Psychiatry

## 2019-11-12 DIAGNOSIS — Z83438 Family history of other disorder of lipoprotein metabolism and other lipidemia: Secondary | ICD-10-CM

## 2019-11-12 DIAGNOSIS — Z833 Family history of diabetes mellitus: Secondary | ICD-10-CM

## 2019-11-12 DIAGNOSIS — Z20822 Contact with and (suspected) exposure to covid-19: Secondary | ICD-10-CM | POA: Diagnosis not present

## 2019-11-12 DIAGNOSIS — Z818 Family history of other mental and behavioral disorders: Secondary | ICD-10-CM | POA: Diagnosis not present

## 2019-11-12 DIAGNOSIS — F1910 Other psychoactive substance abuse, uncomplicated: Secondary | ICD-10-CM | POA: Diagnosis not present

## 2019-11-12 DIAGNOSIS — Z8249 Family history of ischemic heart disease and other diseases of the circulatory system: Secondary | ICD-10-CM

## 2019-11-12 DIAGNOSIS — Z03818 Encounter for observation for suspected exposure to other biological agents ruled out: Secondary | ICD-10-CM | POA: Diagnosis not present

## 2019-11-12 DIAGNOSIS — T424X2A Poisoning by benzodiazepines, intentional self-harm, initial encounter: Secondary | ICD-10-CM | POA: Diagnosis present

## 2019-11-12 DIAGNOSIS — Z79899 Other long term (current) drug therapy: Secondary | ICD-10-CM

## 2019-11-12 DIAGNOSIS — F329 Major depressive disorder, single episode, unspecified: Secondary | ICD-10-CM | POA: Diagnosis present

## 2019-11-12 DIAGNOSIS — Z793 Long term (current) use of hormonal contraceptives: Secondary | ICD-10-CM

## 2019-11-12 DIAGNOSIS — F603 Borderline personality disorder: Secondary | ICD-10-CM | POA: Diagnosis present

## 2019-11-12 DIAGNOSIS — R45851 Suicidal ideations: Secondary | ICD-10-CM | POA: Diagnosis not present

## 2019-11-12 DIAGNOSIS — F332 Major depressive disorder, recurrent severe without psychotic features: Secondary | ICD-10-CM | POA: Diagnosis not present

## 2019-11-12 DIAGNOSIS — F411 Generalized anxiety disorder: Secondary | ICD-10-CM | POA: Diagnosis not present

## 2019-11-12 DIAGNOSIS — F1290 Cannabis use, unspecified, uncomplicated: Secondary | ICD-10-CM | POA: Diagnosis present

## 2019-11-12 DIAGNOSIS — Z596 Low income: Secondary | ICD-10-CM | POA: Diagnosis not present

## 2019-11-12 LAB — SARS CORONAVIRUS 2 BY RT PCR (HOSPITAL ORDER, PERFORMED IN ~~LOC~~ HOSPITAL LAB): SARS Coronavirus 2: NEGATIVE

## 2019-11-12 MED ORDER — TRAZODONE HCL 100 MG PO TABS
100.0000 mg | ORAL_TABLET | Freq: Every evening | ORAL | Status: DC | PRN
  Filled 2019-11-12: qty 1

## 2019-11-12 MED ORDER — ARIPIPRAZOLE 10 MG PO TABS
20.0000 mg | ORAL_TABLET | Freq: Every day | ORAL | Status: DC
  Administered 2019-11-12: 20 mg via ORAL
  Filled 2019-11-12 (×2): qty 2

## 2019-11-12 MED ORDER — LEVONORGESTREL-ETHINYL ESTRAD 0.1-20 MG-MCG PO TABS: 1.0000 | ORAL_TABLET | Freq: Every day | ORAL | Status: DC

## 2019-11-12 MED ORDER — ALUM & MAG HYDROXIDE-SIMETH 200-200-20 MG/5ML PO SUSP: 30.0000 mL | ORAL | Status: DC | PRN

## 2019-11-12 MED ORDER — ADULT MULTIVITAMIN W/MINERALS CH
1.0000 | ORAL_TABLET | Freq: Every day | ORAL | Status: DC
  Administered 2019-11-12 – 2019-11-13 (×2): 1 via ORAL
  Filled 2019-11-12 (×3): qty 1

## 2019-11-12 MED ORDER — ARIPIPRAZOLE 10 MG PO TABS: 20.0000 mg | ORAL_TABLET | Freq: Every evening | ORAL | Status: DC

## 2019-11-12 MED ORDER — SERTRALINE HCL 25 MG PO TABS
125.0000 mg | ORAL_TABLET | Freq: Every day | ORAL | Status: DC
  Administered 2019-11-12: 125 mg via ORAL
  Filled 2019-11-12 (×2): qty 1

## 2019-11-12 MED ORDER — ACETAMINOPHEN 325 MG PO TABS: 650.0000 mg | ORAL_TABLET | Freq: Four times a day (QID) | ORAL | Status: DC | PRN

## 2019-11-12 MED ORDER — MAGNESIUM HYDROXIDE 400 MG/5ML PO SUSP: 30.0000 mL | Freq: Every day | ORAL | Status: DC | PRN

## 2019-11-12 NOTE — H&P (Signed)
Psychiatric Admission Assessment Adult  Patient Identification: Laura Lambert MRN:  [id number] Date of Evaluation:  11/12/2019 Chief Complaint:  MDD (major depressive disorder) [F32.9] Principal Diagnosis: MDD (major depressive disorder), recurrent severe, without psychosis (HCC) Diagnosis:  Principal Problem:   MDD (major depressive disorder), recurrent severe, without psychosis (HCC) Active Problems:   Generalized anxiety disorder   Borderline personality disorder (HCC)  History of Present Illness: Patient's chart reviewed.  Patient is a 24 year old female that presented to the ED after intentional ingestion of 40+ Klonopin 0.5 mg.  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  Patient states that she has not used any marijuana in approximately 2 months and I offered to retest her current urine sample but she does not feel positive that it would be negative if the first time it was positive.  Patient states that after that happened she felt that she had completely ruined her life and she would never get a job and felt that it would be better to die.  Patient stated that she was prescribed Klonopin last October and she is only taken 1 or 2 out of the bottle and she was prescribed 50.  She reports that the UDS was approximately 2 weeks ago and prior to that she had been doing pretty good.  She stated that she had not been having any suicidal thoughts and now not been dealing with any depression or anxiety.  She stated that her medications have kept her stable.  She reports that she is prescribed Abilify 20 mg, Zoloft 125 mg, her birth control, and her trazodone 100 mg as needed.  She states that she takes all of her medication at night.  She reports that she is been diagnosed with depression, anxiety, and borderline  personality disorder.  She reports that she has a therapist at [facility] counseling by the name of Ardyth Harps and they do DBT treatment.  She also reports that she sees a Dr. Joellyn Quails who works at [employer] [facility] in [place].  She states that she sees him by telehealth and her last appointment with him was in early May.  Today she presents labile and tearful.  Patient denies all symptoms prior to the UDS being positive for marijuana.  She denies any substance abuse, except for approximately 1 beer a week.  She does report that she vapes but has refused to get a nicotine patch today. She is denying any current suicidal or homicidal ideations and denies any hallucinations. Patient reports that she has stable housing and lives with her fianc Vernie Shanks.  Associated Signs/Symptoms: Depression Symptoms:  depressed mood, feelings of worthlessness/guilt, hopelessness, suicidal attempt, (Hypo) Manic Symptoms:  Impulsivity, Anxiety Symptoms:  Denies Psychotic Symptoms:  Denies PTSD Symptoms: Negative Total Time spent with patient: 45 minutes  Past Psychiatric History: Patient reports that this will be her fourth hospital admission for mental health issues.  History of depression, anxiety, and borderline personality disorder.  Patient reports that in the sixth grade she was being bullied at school and attempted to drown herself in the bathtub.  She also reports that at 24 years old she had cut her wrist while she was in the bathtub because she got caught shoplifting and was admitted.  Last hospitalization was at [facility] in October  2020.  Is the patient at risk to self? Yes.    Has the patient been a risk to self in the past 6 months? Yes.    Has the patient been a risk to self within the distant past? Yes.    Is the patient a risk to others? No.  Has the patient been a risk to others in the past 6 months? No.  Has the patient been a risk to others within the  distant past? No.   Prior Inpatient Therapy:   Prior Outpatient Therapy:    Alcohol Screening: 1. How often do you have a drink containing alcohol?: Monthly or less 2. How many drinks containing alcohol do you have on a typical day when you are drinking?: 1 or 2 3. How often do you have six or more drinks on one occasion?: Never AUDIT-C Score: 1 4. How often during the last year have you found that you were not able to stop drinking once you had started?: Never 5. How often during the last year have you failed to do what was normally expected from you because of drinking?: Never 6. How often during the last year have you needed a first drink in the morning to get yourself going after a heavy drinking session?: Never 7. How often during the last year have you had a feeling of guilt of remorse after drinking?: Never 8. How often during the last year have you been unable to remember what happened the night before because you had been drinking?: Never 9. Have you or someone else been injured as a result of your drinking?: No 10. Has a relative or friend or a doctor or another health worker been concerned about your drinking or suggested you cut down?: No Alcohol Use Disorder Identification Test Final Score (AUDIT): 1 Substance Abuse History in the last 12 months:  Yes.   Consequences of Substance Abuse: Medical Consequences:  reviewed Legal Consequences:  reviewed Family Consequences:  reviewed Previous Psychotropic Medications: Yes  Psychological Evaluations: Yes  Past Medical History: History reviewed. No pertinent past medical history.  Past Surgical History:  Procedure Laterality Date  . DENTAL SURGERY     Family History:  Family History  Problem Relation Age of Onset  . Cancer Other   . Hyperlipidemia Other   . Hypertension Other   . Diabetes Other   . Heart attack Other   . Hypertension Father   . Depression Maternal Grandmother    Family Psychiatric  History: Grandmother  attempted suicide Tobacco Screening:   Social History:  Social History   Substance and Sexual Activity  Alcohol Use Yes   Comment: Occ     Social History   Substance and Sexual Activity  Drug Use Yes  . Types: Marijuana    Additional Social History:                           Allergies:  No Known Allergies Lab Results:  Results for orders placed or performed during the hospital encounter of 11/11/19 (from the past 48 hour(s))  Comprehensive metabolic panel     Status: Abnormal   Collection Time: 11/11/19  7:57 PM  Result Value Ref Range   Sodium 141 135 - 145 mmol/L   Potassium 3.7 3.5 - 5.1 mmol/L   Chloride 108 98 - 111 mmol/L   CO2 26 22 - 32 mmol/L   Glucose, Bld 90 70 - 99 mg/dL    Comment:  Glucose reference range applies only to samples taken after fasting for at least 8 hours.   BUN 14 6 - 20 mg/dL   Creatinine, Ser 1.610.71 0.44 - 1.00 mg/dL   Calcium 8.8 (L) 8.9 - 10.3 mg/dL   Total Protein 6.8 6.5 - 8.1 g/dL   Albumin 3.8 3.5 - 5.0 g/dL   AST 14 (L) 15 - 41 U/L   ALT 13 0 - 44 U/L   Alkaline Phosphatase 56 38 - 126 U/L   Total Bilirubin 0.4 0.3 - 1.2 mg/dL   GFR calc non Af Amer >60 >60 mL/min   GFR calc Af Amer >60 >60 mL/min   Anion gap 7 5 - 15    Comment: Performed at [facility], [address] [place], [place] [address]  cbc     Status: None   Collection Time: 11/11/19  7:57 PM  Result Value Ref Range   WBC 7.4 4.0 - 10.5 K/uL   RBC 4.03 3.87 - 5.11 MIL/uL   Hemoglobin 13.0 12.0 - 15.0 g/dL   HCT 54.038.6 98.136.0 - 19.146.0 %   MCV 95.8 80.0 - 100.0 fL   MCH 32.3 26.0 - 34.0 pg   MCHC 33.7 30.0 - 36.0 g/dL   RDW 47.811.9 29.511.5 - 62.115.5 %   Platelets 286 150 - 400 K/uL   nRBC 0.0 0.0 - 0.2 %    Comment: Performed at [facility], [address] [place], [place] [address]  Ethanol     Status: None   Collection Time: 11/11/19  7:58 PM  Result Value Ref Range   Alcohol, Ethyl (B) <10 <10 mg/dL    Comment:  (NOTE) Lowest detectable limit for serum alcohol is 10 mg/dL. For medical purposes only. Performed at [facility], [address] [place], [place] [address]   Salicylate level     Status: Abnormal   Collection Time: 11/11/19  7:58 PM  Result Value Ref Range   Salicylate Lvl <7.0 (L) 7.0 - 30.0 mg/dL    Comment: Performed at [facility], [address] [place], [place] [address]  Acetaminophen level     Status: Abnormal   Collection Time: 11/11/19  7:58 PM  Result Value Ref Range   Acetaminophen (Tylenol), Serum <10 (L) 10 - 30 ug/mL    Comment: (NOTE) Therapeutic concentrations vary significantly. A range of 10-30 ug/mL  may be an effective concentration for many patients. However, some  are best treated at concentrations outside of this range. Acetaminophen concentrations >150 ug/mL at 4 hours after ingestion  and >50 ug/mL at 12 hours after ingestion are often associated with  toxic reactions. Performed at [facility], [address] [place], [place] [address]   I-Stat beta hCG blood, ED     Status: None   Collection Time: 11/11/19  8:03 PM  Result Value Ref Range   I-stat hCG, quantitative <5.0 <5 mIU/mL   Comment 3            Comment:   GEST. AGE      CONC.  (mIU/mL)   <=1 WEEK        5 - 50     2 WEEKS       50 - 500     3 WEEKS       100 - 10,000     4 WEEKS     1,000 - 30,000        FEMALE AND  NON-PREGNANT FEMALE:     LESS THAN 5 mIU/mL   Rapid urine drug screen (hospital performed)     Status: Abnormal   Collection Time: 11/11/19  9:20 PM  Result Value Ref Range   Opiates NONE DETECTED NONE DETECTED   Cocaine NONE DETECTED NONE DETECTED   Benzodiazepines POSITIVE (A) NONE DETECTED   Amphetamines NONE DETECTED NONE DETECTED   Tetrahydrocannabinol POSITIVE (A) NONE DETECTED   Barbiturates NONE DETECTED NONE DETECTED    Comment: (NOTE) DRUG SCREEN FOR MEDICAL PURPOSES ONLY.  IF CONFIRMATION IS  NEEDED FOR ANY PURPOSE, NOTIFY LAB WITHIN 5 DAYS. LOWEST DETECTABLE LIMITS FOR URINE DRUG SCREEN Drug Class                     Cutoff (ng/mL) Amphetamine and metabolites    1000 Barbiturate and metabolites    200 Benzodiazepine                 200 Tricyclics and metabolites     300 Opiates and metabolites        300 Cocaine and metabolites        300 THC                            50 Performed at [facility], [address], [place], [place] [address]   Acetaminophen level     Status: Abnormal   Collection Time: 11/11/19  9:39 PM  Result Value Ref Range   Acetaminophen (Tylenol), Serum <10 (L) 10 - 30 ug/mL    Comment: (NOTE) Therapeutic concentrations vary significantly. A range of 10-30 ug/mL  may be an effective concentration for many patients. However, some  are best treated at concentrations outside of this range. Acetaminophen concentrations >150 ug/mL at 4 hours after ingestion  and >50 ug/mL at 12 hours after ingestion are often associated with  toxic reactions. Performed at [facility], [address] [place], [place] [address]   SARS Coronavirus 2 by RT PCR (hospital order, performed in [facility] hospital lab) Nasopharyngeal Nasopharyngeal Swab     Status: None   Collection Time: 11/11/19 10:49 PM   Specimen: Nasopharyngeal Swab  Result Value Ref Range   SARS Coronavirus 2 NEGATIVE NEGATIVE    Comment: (NOTE) SARS-CoV-2 target nucleic acids are NOT DETECTED. The SARS-CoV-2 RNA is generally detectable in upper and lower respiratory specimens during the acute phase of infection. The lowest concentration of SARS-CoV-2 viral copies this assay can detect is 250 copies / mL. A negative result does not preclude SARS-CoV-2 infection and should not be used as the sole basis for treatment or other patient management decisions.  A negative result may occur with improper specimen collection / handling, submission of specimen  other than nasopharyngeal swab, presence of viral mutation(s) within the areas targeted by this assay, and inadequate number of viral copies (<250 copies / mL). A negative result must be combined with clinical observations, patient history, and epidemiological information. Fact Sheet for Patients:   BoilerBrush.com.cy Fact Sheet for Healthcare Providers: https://pope.com/ This test is not yet approved or cleared  by the [place] FDA and has been authorized for detection and/or diagnosis of SARS-CoV-2 by FDA under an Emergency Use Authorization (EUA).  This EUA will remain in effect (meaning this test can be used) for the duration of the COVID-19 declaration under Section 564(b)(1) of the Act, 21 U.S.C. section 360bbb-3(b)(1), unless the authorization is terminated  or revoked sooner. Performed at [facility], [address] [place], [place] [address]     Blood Alcohol level:  Lab Results  Component Value Date   ETH <10 11/11/2019    Metabolic Disorder Labs:  No results found for: HGBA1C, MPG No results found for: PROLACTIN No results found for: CHOL, TRIG, HDL, CHOLHDL, VLDL, LDLCALC  Current Medications: Current Facility-Administered Medications  Medication Dose Route Frequency Provider Last Rate Last Admin  . acetaminophen (TYLENOL) tablet 650 mg  650 mg Oral Q6H PRN Anike, Adaku C, NP      . alum & mag hydroxide-simeth (MAALOX/MYLANTA) 200-200-20 MG/5ML suspension 30 mL  30 mL Oral Q4H PRN Anike, Adaku C, NP      . magnesium hydroxide (MILK OF MAGNESIA) suspension 30 mL  30 mL Oral Daily PRN Anike, Adaku C, NP       PTA Medications: Medications Prior to Admission  Medication Sig Dispense Refill Last Dose  . ARIPiprazole (ABILIFY) 20 MG tablet Take 20 mg by mouth every evening.      . clonazePAM (KLONOPIN) 0.5 MG tablet Take 0.5 mg by mouth once.     . LESSINA-28 0.1-20 MG-MCG tablet Take 1 tablet by  mouth daily.     . Multiple Vitamins-Minerals (MULTIVITAMIN WITH MINERALS) tablet Take 1 tablet by mouth daily.     . sertraline (ZOLOFT) 100 MG tablet Take 1 tablet (100 mg total) by mouth daily. 90 tablet 0   . sertraline (ZOLOFT) 50 MG tablet Take 125 mg by mouth at bedtime.      . traZODone (DESYREL) 50 MG tablet Take 1-2 tabs po QHS prn insomnia (Patient taking differently: Take 50 mg by mouth at bedtime as needed for sleep. ) 180 tablet 0     Musculoskeletal: Strength & Muscle Tone: within normal limits Gait & Station: normal Patient leans: N/A  Psychiatric Specialty Exam: Physical Exam  Nursing note and vitals reviewed. Constitutional: She is oriented to person, place, and time. She appears well-developed and well-nourished.  Respiratory: Effort normal.  Musculoskeletal:        General: Normal range of motion.  Neurological: She is alert and oriented to person, place, and time.  Skin: Skin is warm.  Psychiatric: Her mood appears anxious. Her affect is labile. She exhibits a depressed mood.    Review of Systems  Constitutional: Negative.   HENT: Negative.   Eyes: Negative.   Respiratory: Negative.   Cardiovascular: Negative.   Gastrointestinal: Negative.   Genitourinary: Negative.   Musculoskeletal: Negative.   Skin: Negative.   Neurological: Negative.   Psychiatric/Behavioral: Positive for dysphoric mood. The patient is nervous/anxious.     Blood pressure 97/60, pulse (!) 56, temperature 98.1 F (36.7 C), temperature source Oral, resp. rate 16, height  (1.676 m), weight 68 kg, SpO2 97 %.Body mass index is 24.21 kg/m.  General Appearance: Disheveled  Eye Contact:  Fair  Speech:  Clear and Coherent and Normal Rate  Volume:  Decreased  Mood:  Dysphoric  Affect:  Labile  Thought Process:  Coherent and Descriptions of Associations: Intact  Orientation:  Full (Time, Place, and Person)  Thought Content:  WDL  Suicidal Thoughts:  No  Homicidal Thoughts:  No   Memory:  Immediate;   Fair Recent;   Fair Remote;   Fair  Judgement:  Fair  Insight:  Fair  Psychomotor Activity:  Normal  Concentration:  Concentration: Fair  Recall:  [employer] of Knowledge:  Fair  Language:  Fair  Akathisia:  No  Handed:  Right  AIMS (if indicated):     Assets:  Communication Skills Desire for Improvement Financial Resources/Insurance Housing Physical Health Resilience Social Support Transportation  ADL's:  Intact  Cognition:  WNL  Sleep:       Treatment Plan Summary: Daily contact with patient to assess and evaluate symptoms and progress in treatment and Medication management  Consulted with Dr. Tamera Punt on patient.  We will restart patient on her Abilify 20 mg p.o. nightly, Zoloft 125 mg p.o. nightly, her birth control, and trazodone 100 mg p.o. nightly as needed.  Reviewed labs and will order lipid panel and TSH.  UDS was positive for benzodiazepines and cannabis.  EKG was done on 11/11/2019 and QTC was 440 with sinus rhythm.  Patient admits to vaping with nicotine product but she is refusing to use a nicotine patch or gum at this time.  Patient is already established with a psychiatrist and therapist that does DBT for her borderline personality disorder.  Observation Level/Precautions:  15 minute checks  Laboratory:  Reviewed and ordered TSH and lipid panel.  Psychotherapy:  Group therapy  Medications:  See MAR   Consultations:  As needed  Discharge Concerns:  None  Estimated LOS: 3-5 days  Other:  Admit to [address]   Physician Treatment Plan for Primary Diagnosis: MDD (major depressive disorder), recurrent severe, without psychosis (HCC) Long Term Goal(s): Improvement in symptoms so as ready for discharge  Short Term Goals: Ability to identify changes in lifestyle to reduce recurrence of condition will improve, Ability to verbalize feelings will improve, Ability to disclose and discuss suicidal ideas, Ability to demonstrate self-control will improve,  Ability to identify and develop effective coping behaviors will improve, Ability to maintain clinical measurements within normal limits will improve, Compliance with prescribed medications will improve and Ability to identify triggers associated with substance abuse/mental health issues will improve  Physician Treatment Plan for Secondary Diagnosis: Principal Problem:   MDD (major depressive disorder), recurrent severe, without psychosis (HCC) Active Problems:   Generalized anxiety disorder   Borderline personality disorder (HCC)  Long Term Goal(s): Improvement in symptoms so as ready for discharge  Short Term Goals: Ability to identify changes in lifestyle to reduce recurrence of condition will improve, Ability to verbalize feelings will improve, Ability to disclose and discuss suicidal ideas, Ability to demonstrate self-control will improve, Ability to identify and develop effective coping behaviors will improve, Ability to maintain clinical measurements within normal limits will improve, Compliance with prescribed medications will improve and Ability to identify triggers associated with substance abuse/mental health issues will improve  I certify that inpatient services furnished can reasonably be expected to improve the patient's condition.    Maryfrances Bunnell, FNP 5/20/20211:33 PM

## 2019-11-12 NOTE — Tx Team (Signed)
Initial Treatment Plan 11/12/2019 11:48 AM Laura Lambert SPZ:[id number]    PATIENT STRESSORS: Financial difficulties Occupational concerns Substance abuse   PATIENT STRENGTHS: Ability for insight Communication skills Supportive family/friends   PATIENT IDENTIFIED PROBLEMS: Suicidal ideation  Depression  Substance Abuse                 DISCHARGE CRITERIA:  Ability to meet basic life and health needs Adequate post-discharge living arrangements Motivation to continue treatment in a less acute level of care  PRELIMINARY DISCHARGE PLAN: Attend aftercare/continuing care group Outpatient therapy Return to previous living arrangement  PATIENT/FAMILY INVOLVEMENT: This treatment plan has been presented to and reviewed with the patient, Laura Lambert, and/or family member.  The patient and family have been given the opportunity to ask questions and make suggestions.  Clarene Critchley, RN 11/12/2019, 11:48 AM

## 2019-11-12 NOTE — Progress Notes (Signed)
Pt accepted to [facility] 302-1 after 10am to Dr. Jama Flavors, MD. Call to report [phone]. Weston Brass, RN has been advised.

## 2019-11-12 NOTE — BH Assessment (Signed)
BHH Assessment Progress Note  Per [name], NP, this voluntary pt requires psychiatric hospitalization at this time.  Pt has been assigned pt to [facility] Rm 302-1.  Pt's nurse has been notified.  Doylene Canning, [place] Behavioral Health Coordinator [phone]

## 2019-11-12 NOTE — ED Notes (Signed)
Pt's belongings labeled in a pt belonging bag, at nurse's station

## 2019-11-12 NOTE — Progress Notes (Signed)
Pt had minimal interaction on the unit this evening, pt encouraged to drink Gatorade    11/12/19 2200  Psych Admission Type (Psych Patients Only)  Admission Status Voluntary  Psychosocial Assessment  Patient Complaints Anxiety  Eye Contact Fair  Facial Expression Sad  Affect Anxious;Depressed;Sad;Sullen;Blunted  Speech Logical/coherent  Interaction Minimal  Motor Activity Lethargic  Appearance/Hygiene Unremarkable  Behavior Characteristics Cooperative  Mood Depressed  Aggressive Behavior  Effect No apparent injury  Thought Process  Coherency WDL  Content WDL  Delusions WDL  Perception WDL  Hallucination None reported or observed  Judgment WDL  Confusion WDL  Danger to Self  Current suicidal ideation? Denies  Danger to Others  Danger to Others None reported or observed

## 2019-11-12 NOTE — ED Notes (Signed)
Safe transport called for patient  

## 2019-11-12 NOTE — Progress Notes (Signed)
After dinner patient went back to bed resting.  VS taken and BP was 93/55  P76.  Patient drank several cups of gatorade and walked up and down the hallway.  BP was 108/67  P86.  Patient has been encouraged by staff to walk, sit in dayroom, drink fluids, gatorade. Respirations even and unlabored.  No signs/symptoms of pain/distress noted on patient's face/body movements.

## 2019-11-12 NOTE — BHH Suicide Risk Assessment (Signed)
[facility] Admission Suicide Risk Assessment   Nursing information obtained from:  Patient Demographic factors:  Adolescent or young adult Current Mental Status:  Suicide plan Loss Factors:  Decrease in vocational status, Legal issues, Financial problems / change in socioeconomic status Historical Factors:  Prior suicide attempts Risk Reduction Factors:  Positive social support  Total Time spent with patient: 20 minutes Principal Problem: MDD (major depressive disorder), recurrent severe, without psychosis (HCC) Diagnosis:  Principal Problem:   MDD (major depressive disorder), recurrent severe, without psychosis (HCC) Active Problems:   Generalized anxiety disorder   Borderline personality disorder (HCC)  Subjective Data: Patient is seen and examined.  Patient is a 24 year old female with a reported history of depression and borderline personality disorder who presented to the [facility] after she intentionally ingested an entire bottle of Klonopin.  This was a suicide attempt.  She stated that she had not tried to hurt her self and approximately 6 years.  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  This was her dream job and it upset her greatly.  She had an old bottle of Klonopin, and took the entire thing.  She is significantly remorseful about this and regrets it.  At the time of examination she still significantly sedated from the Klonopin.  She has been on stable medications for quite a while and has a CBT/DBT therapist.  Prior to finding out about her job she reportedly had been doing quite well.  Her last psychiatric hospitalization in our facility was in 2010 on the adolescent unit.  There were no additional psychiatric hospitalizations apparent on be care everywhere record.  She was admitted to the hospital for evaluation and stabilization.  Continued Clinical Symptoms:  Alcohol Use Disorder Identification  Test Final Score (AUDIT): 1 The "Alcohol Use Disorders Identification Test", Guidelines for Use in Primary Care, Second Edition.  World Science writer [facility]). Score between 0-7:  no or low risk or alcohol related problems. Score between 8-15:  moderate risk of alcohol related problems. Score between 16-19:  high risk of alcohol related problems. Score 20 or above:  warrants further diagnostic evaluation for alcohol dependence and treatment.   CLINICAL FACTORS:   Bipolar Disorder:   Mixed State Depression:   Anhedonia Impulsivity Personality Disorders:   Cluster B More than one psychiatric diagnosis   Musculoskeletal: Strength & Muscle Tone: within normal limits Gait & Station: normal Patient leans: N/A  Psychiatric Specialty Exam: Physical Exam  Nursing note and vitals reviewed. Constitutional: She appears well-developed and well-nourished.  HENT:  Head: Normocephalic and atraumatic.  Respiratory: Effort normal.  Neurological: She is alert.    Review of Systems  Blood pressure 97/60, pulse (!) 56, temperature 98.1 F (36.7 C), temperature source Oral, resp. rate 16, height 5\' 6"  (1.676 m), weight 68 kg, SpO2 97 %.Body mass index is 24.21 kg/m.  General Appearance: Disheveled  Eye Contact:  Minimal  Speech:  Slow  Volume:  Decreased  Mood:  Sedated  Affect:  Congruent  Thought Process:  Goal Directed and Descriptions of Associations: Intact  Orientation:  Negative  Thought Content:  Logical  Suicidal Thoughts:  No  Homicidal Thoughts:  No  Memory:  Immediate;   Fair Recent;   Fair Remote;   Fair  Judgement:  Intact  Insight:  Fair  Psychomotor Activity:  Decreased  Concentration:  Concentration: Fair and Attention Span: Fair  Recall:  Fair  Fund of Knowledge:  Fair  Language:  Fair  Akathisia:  Negative  Handed:  Right  AIMS (if indicated):     Assets:  Desire for Improvement Housing Resilience  ADL's:  Intact  Cognition:  WNL  Sleep:          COGNITIVE FEATURES THAT CONTRIBUTE TO RISK:  None    SUICIDE RISK:   Mild:  Suicidal ideation of limited frequency, intensity, duration, and specificity.  There are no identifiable plans, no associated intent, mild dysphoria and related symptoms, good self-control (both objective and subjective assessment), few other risk factors, and identifiable protective factors, including available and accessible social support.  PLAN OF CARE: Patient is seen and examined.  Patient is a 24 year old female with the above-stated past psychiatric history who was admitted after an intentional overdose of clonazepam.  She will be admitted to the hospital.  She will be integrated in the milieu.  She will be encouraged to attend groups.  She reports previously being treated successfully with Abilify, trazodone and sertraline.  The Abilify and sertraline will be continued.  We will also continue her birth control pills.  The trazodone will be put at 100 mg p.o. nightly as needed.  She is significantly sedated right now from the overdose.  Her most recent vital signs show a heart rate of 56, respirations of 16, blood pressure of 97/60.  Her pulse oximetry was 97% on room air.  She is afebrile.  Her laboratories revealed essentially normal electrolytes, normal CBC, less than 10 acetaminophen, less than 7 salicylate.  Her beta-hCG was negative.  Blood alcohol was less than 10.  Drug screen was positive for benzodiazepines as well as marijuana.  Her EKG showed a sinus rhythm with a QTc interval of 430.  We will continue to monitor her vital signs as well as her pulse oximetry especially during the first 24 hours in the unit.  I certify that inpatient services furnished can reasonably be expected to improve the patient's condition.   Sharma Covert, MD 11/12/2019, 2:42 PM

## 2019-11-12 NOTE — Progress Notes (Signed)
Patient was encouraged to get out of bed, eat snack, drank 5 cups gatorade, walk up and down hallway, stay out of bed.  NP informed who talked to patient. Respirations even and unlabored.  No signs/symptoms of pain/distress noted on patient's face/body movements.  Presently patient in dayroom, eating small snack and drinking gatorade.
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
## 2019-11-12 NOTE — ED Notes (Signed)
Pt's significant other took her wallet and keys with her permission

## 2019-11-12 NOTE — ED Provider Notes (Signed)
  Physical Exam  BP (!) 92/59   Pulse 62   Temp 98 F (36.7 C)   Resp 16   Ht 5\' 6"  (1.676 m)   Wt 65.8 kg   SpO2 100%   BMI 23.40 kg/m   Physical Exam  ED Course/Procedures   Clinical Course as of Nov 11 128  Wed Nov 11, 2019  2106 Poison control says we need a second tylenol level around 9:30 and that as long as she is back to baseline 6 hours after taking the clonazepam she should be medically cleared   [HK]  2342 Medically cleared at 1:30   [KM]  Thu Nov 12, 2019  0128 Pt accepted to [facility] 302-1 after 10am to Dr. [facility], MD. Call to report [phone]. 07-22-1999, RN has been advised.    [KM]    Clinical Course User Index [HK] Weston Brass, PA-C [KM] Dietrich Pates    Procedures  MDM  Patient care signed out to me by Jeral Pinch PA due to change of shift, please see her note for full HPI. Briefly, patient had intentional OD on clonzepam with plan to kill herself. Now medically cleared and accepted to [facility] in the morning. Voluntary.       [facility], PA-C 11/12/19 11/14/19    [place]ard, [id number], DO 11/12/19 [phone]
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
## 2019-11-13 NOTE — Progress Notes (Signed)
Recreation Therapy Notes  Date:  5.21.21 Time: 0930 Location: 300 Hall Group Room  Group Topic: Stress Management  Goal Area(s) Addresses:  Patient will identify positive stress management techniques. Patient will identify benefits of using stress management post d/c.  Intervention: Stress Management  Activity:  Meditation.  LRT played a meditation that focused on being resilient in the face of adversity.  Patients were to listen and follow along as meditation played to participate.    Education:  Stress Management, Discharge Planning.   Education Outcome: Acknowledges Education  Clinical Observations/Feedback: Pt did not attend group activity.    Caroll Rancher, LRT/CTRS         Caroll Rancher A 11/13/2019 11:35 AM

## 2019-11-13 NOTE — BHH Suicide Risk Assessment (Signed)
[facility] Discharge Suicide Risk Assessment   Principal Problem: MDD (major depressive disorder), recurrent severe, without psychosis (HCC) Discharge Diagnoses: Principal Problem:   MDD (major depressive disorder), recurrent severe, without psychosis (HCC) Active Problems:   Generalized anxiety disorder   Borderline personality disorder (HCC)   Total Time spent with patient: 20 minutes  Musculoskeletal: Strength & Muscle Tone: within normal limits Gait & Station: normal Patient leans: N/A  Psychiatric Specialty Exam: Review of Systems  All other systems reviewed and are negative.   Blood pressure 96/63, pulse 71, temperature 98.2 F (36.8 C), temperature source Oral, resp. rate 16, height 5\' 6"  (1.676 m), weight 68 kg, SpO2 100 %.Body mass index is 24.21 kg/m.  General Appearance: Casual  Eye Contact::  Fair  Speech:  Normal Rate409  Volume:  Decreased  Mood:  Euthymic  Affect:  Congruent  Thought Process:  Coherent and Descriptions of Associations: Intact  Orientation:  Full (Time, Place, and Person)  Thought Content:  Logical  Suicidal Thoughts:  No  Homicidal Thoughts:  No  Memory:  Immediate;   Fair Recent;   Fair Remote;   Fair  Judgement:  Intact  Insight:  Fair  Psychomotor Activity:  Decreased  Concentration:  Fair  Recall:  [phone] of Knowledge:Good  Language: Good  Akathisia:  Negative  Handed:  Right  AIMS (if indicated):     Assets:  Desire for Improvement Financial Resources/Insurance Housing Resilience Social Support  Sleep:  Number of Hours: 6  Cognition: WNL  ADL's:  Intact   Mental Status Per Nursing Assessment::   On Admission:  Suicide plan  Demographic Factors:  Caucasian and Unemployed  Loss Factors: Decrease in vocational status  Historical Factors: Impulsivity  Risk Reduction Factors:   Living with another person, especially a relative, Positive social support and Positive therapeutic relationship  Continued Clinical  Symptoms:  Bipolar Disorder:   Depressive phase Personality Disorders:   Cluster B  Cognitive Features That Contribute To Risk:  None    Suicide Risk:  Minimal: No identifiable suicidal ideation.  Patients presenting with no risk factors but with morbid ruminations; may be classified as minimal risk based on the severity of the depressive symptoms    Plan Of Care/Follow-up recommendations:  Activity:  ad lib  [phone], MD 11/13/2019, 9:22 AM

## 2019-11-13 NOTE — [facility] Counselor (Signed)
Adult Comprehensive Assessment  Patient ID: KRIZIA FLIGHT, female   DOB: [birth year], 24 y.o.   MRN: [id number]  CSW unable to complete full psycho-social assessment, due to the patient discharging within 24 hours of admission. Patient will continue to follow up with current outpatient providers. CSW spoke with the patient's fiance, who reports he does not have any concerns regarding her discharge. Patient reports her parents are picking her up from the hospital and that she will temporarily live with them for additional support.   CSW will continue to follow for a safe discharge.     Summary/Recommendations:   Summary and Recommendations (to be completed by the evaluator): CSW unable to complete full psycho-social assessment, due to the patient discharging within 24 hours of admission. Marenda is a 24 year old female who is diagnosed with MDD (major depressive disorder), recurrent severe, without psychosis,  Generalized anxiety disorder and Borderline personality disorder. She presented to the hospital seeking treatment for intentional ingestion of 40+ Klonopin 0.5 mg. During the engagement, Naziah was pleasant and cooperative with providing information. She reports that she feels confident about discharging home. She states that she had the opportuniuty to have her medications adjusted. Cindy reports she will continue to follow up with her current outpatient psychiatric provider for continuity of care. Mattison can benefit from crisis stabilization, medication management, therapeutic milieu and referral services.  Maeola Sarah. 11/13/2019

## 2019-11-13 NOTE — Tx Team (Signed)
Interdisciplinary Treatment and Diagnostic Plan Update  11/13/2019 Time of Session: 9:00am ITZAE MCCURDY MRN: [id number]  Principal Diagnosis: MDD (major depressive disorder), recurrent severe, without psychosis (HCC)  Secondary Diagnoses: Principal Problem:   MDD (major depressive disorder), recurrent severe, without psychosis (HCC) Active Problems:   Generalized anxiety disorder   Borderline personality disorder (HCC)   Current Medications:  Current Facility-Administered Medications  Medication Dose Route Frequency Provider Last Rate Last Admin  . acetaminophen (TYLENOL) tablet 650 mg  650 mg Oral Q6H PRN Anike, Adaku C, NP      . alum & mag hydroxide-simeth (MAALOX/MYLANTA) 200-200-20 MG/5ML suspension 30 mL  30 mL Oral Q4H PRN Anike, Adaku C, NP      . ARIPiprazole (ABILIFY) tablet 20 mg  20 mg Oral QHS Money, Travis B, FNP   20 mg at 11/12/19 2139  . levonorgestrel-ethinyl estradiol (ALESSE) 0.1-20 MG-MCG per tablet 1 tablet  1 tablet Oral Daily Money, Travis B, FNP      . magnesium hydroxide (MILK OF MAGNESIA) suspension 30 mL  30 mL Oral Daily PRN Anike, Adaku C, NP      . multivitamin with minerals tablet 1 tablet  1 tablet Oral Daily Money, Gerlene Burdock, FNP   1 tablet at 11/13/19 0805  . sertraline (ZOLOFT) tablet 125 mg  125 mg Oral QHS Money, Gerlene Burdock, FNP   125 mg at 11/12/19 2139  . traZODone (DESYREL) tablet 100 mg  100 mg Oral QHS PRN Money, Gerlene Burdock, FNP       PTA Medications: Medications Prior to Admission  Medication Sig Dispense Refill Last Dose  . ARIPiprazole (ABILIFY) 20 MG tablet Take 20 mg by mouth every evening.      . LESSINA-28 0.1-20 MG-MCG tablet Take 1 tablet by mouth daily.     . Multiple Vitamins-Minerals (MULTIVITAMIN WITH MINERALS) tablet Take 1 tablet by mouth daily.     . sertraline (ZOLOFT) 100 MG tablet Take 1 tablet (100 mg total) by mouth daily. 90 tablet 0   . sertraline (ZOLOFT) 50 MG tablet Take 125 mg by mouth at bedtime.      .  traZODone (DESYREL) 50 MG tablet Take 1-2 tabs po QHS prn insomnia (Patient taking differently: Take 50 mg by mouth at bedtime as needed for sleep. ) 180 tablet 0     Patient Stressors: Financial difficulties Occupational concerns Substance abuse  Patient Strengths: Ability for insight Communication skills Supportive family/friends  Treatment Modalities: Medication Management, Group therapy, Case management,  1 to 1 session with clinician, Psychoeducation, Recreational therapy.   Physician Treatment Plan for Primary Diagnosis: MDD (major depressive disorder), recurrent severe, without psychosis (HCC) Long Term Goal(s): Improvement in symptoms so as ready for discharge Improvement in symptoms so as ready for discharge   Short Term Goals: Ability to identify changes in lifestyle to reduce recurrence of condition will improve Ability to verbalize feelings will improve Ability to disclose and discuss suicidal ideas Ability to demonstrate self-control will improve Ability to identify and develop effective coping behaviors will improve Ability to maintain clinical measurements within normal limits will improve Compliance with prescribed medications will improve Ability to identify triggers associated with substance abuse/mental health issues will improve Ability to identify changes in lifestyle to reduce recurrence of condition will improve Ability to verbalize feelings will improve Ability to disclose and discuss suicidal ideas Ability to demonstrate self-control will improve Ability to identify and develop effective coping behaviors will improve Ability to maintain clinical measurements within normal limits  will improve Compliance with prescribed medications will improve Ability to identify triggers associated with substance abuse/mental health issues will improve  Medication Management: Evaluate patient's response, side effects, and tolerance of medication regimen.  Therapeutic  Interventions: 1 to 1 sessions, Unit Group sessions and Medication administration.  Evaluation of Outcomes: Adequate for Discharge  Physician Treatment Plan for Secondary Diagnosis: Principal Problem:   MDD (major depressive disorder), recurrent severe, without psychosis ([place]) Active Problems:   Generalized anxiety disorder   Borderline personality disorder ([place])  Long Term Goal(s): Improvement in symptoms so as ready for discharge Improvement in symptoms so as ready for discharge   Short Term Goals: Ability to identify changes in lifestyle to reduce recurrence of condition will improve Ability to verbalize feelings will improve Ability to disclose and discuss suicidal ideas Ability to demonstrate self-control will improve Ability to identify and develop effective coping behaviors will improve Ability to maintain clinical measurements within normal limits will improve Compliance with prescribed medications will improve Ability to identify triggers associated with substance abuse/mental health issues will improve Ability to identify changes in lifestyle to reduce recurrence of condition will improve Ability to verbalize feelings will improve Ability to disclose and discuss suicidal ideas Ability to demonstrate self-control will improve Ability to identify and develop effective coping behaviors will improve Ability to maintain clinical measurements within normal limits will improve Compliance with prescribed medications will improve Ability to identify triggers associated with substance abuse/mental health issues will improve     Medication Management: Evaluate patient's response, side effects, and tolerance of medication regimen.  Therapeutic Interventions: 1 to 1 sessions, Unit Group sessions and Medication administration.  Evaluation of Outcomes: Adequate for Discharge   RN Treatment Plan for Primary Diagnosis: MDD (major depressive disorder), recurrent severe, without psychosis  ([place]) Long Term Goal(s): Knowledge of disease and therapeutic regimen to maintain health will improve  Short Term Goals: Ability to identify and develop effective coping behaviors will improve and Compliance with prescribed medications will improve  Medication Management: RN will administer medications as ordered by provider, will assess and evaluate patient's response and provide education to patient for prescribed medication. RN will report any adverse and/or side effects to prescribing provider.  Therapeutic Interventions: 1 on 1 counseling sessions, Psychoeducation, Medication administration, Evaluate responses to treatment, Monitor vital signs and CBGs as ordered, Perform/monitor CIWA, COWS, AIMS and Fall Risk screenings as ordered, Perform wound care treatments as ordered.  Evaluation of Outcomes: Adequate for Discharge   LCSW Treatment Plan for Primary Diagnosis: MDD (major depressive disorder), recurrent severe, without psychosis ([place]) Long Term Goal(s): Safe transition to appropriate next level of care at discharge, Engage patient in therapeutic group addressing interpersonal concerns.  Short Term Goals: Engage patient in aftercare planning with referrals and resources, Increase social support, Identify triggers associated with mental health/substance abuse issues and Increase skills for wellness and recovery  Therapeutic Interventions: Assess for all discharge needs, 1 to 1 time with Social worker, Explore available resources and support systems, Assess for adequacy in community support network, Educate family and significant other(s) on suicide prevention, Complete Psychosocial Assessment, Interpersonal group therapy.  Evaluation of Outcomes: Adequate for Discharge  Progress in Treatment: Attending groups: No. Participating in groups: No. Taking medication as prescribed: Yes. Toleration medication: Yes. Family/Significant other contact made: No, will contact:  supports if  consents are granted. Patient understands diagnosis: Yes. Discussing patient identified problems/goals with staff: Yes. Medical problems stabilized or resolved: Yes. Denies suicidal/homicidal ideation: Yes. Issues/concerns per patient self-inventory: No.  New problem(s) identified: Yes, Describe:  financial stressors.   New Short Term/Long Term Goal(s): detox, medication management for mood stabilization; elimination of SI thoughts; development of comprehensive mental wellness/sobriety plan.  Patient Goals:  "Learn better coping mechanisms."  Discharge Plan or Barriers: Returning home with family supports, CSW assessing for appropriate referrals.   Reason for Continuation of Hospitalization: Anxiety Depression  Estimated Length of Stay: discharging today  Attendees: Patient: Laura Lambert 11/13/2019 9:20 AM  Physician:  11/13/2019 9:20 AM  Nursing:  11/13/2019 9:20 AM  RN Care Manager: 11/13/2019 9:20 AM  Social Worker: Enid Cutter, [place] 11/13/2019 9:20 AM  Recreational Therapist:  11/13/2019 9:20 AM  Other: Reola Calkins, NP 11/13/2019 9:20 AM  Other:  11/13/2019 9:20 AM  Other: 11/13/2019 9:20 AM    Scribe for Treatment Team: Darreld Mclean, LCSWA 11/13/2019 9:20 AM

## 2019-11-13 NOTE — [facility] Suicide Risk Assessment (Addendum)
[facility] INPATIENT:  Family/Significant Other Suicide Prevention Education  Suicide Prevention Education:  Education Completed; with fiance, Vivien Rota [phone]) has been identified by the patient as the family member/significant other with whom the patient will be residing, and identified as the person(s) who will aid the patient in the event of a mental health crisis (suicidal ideations/suicide attempt).  With written consent from the patient, the family member/significant other has been provided the following suicide prevention education, prior to the and/or following the discharge of the patient.  The suicide prevention education provided includes the following:  Suicide risk factors  Suicide prevention and interventions  National Suicide Hotline telephone number  [facility] assessment telephone number  [facility] Emergency Assistance 911  [facility] and/or Residential Mobile Crisis Unit telephone number  Request made of family/significant other to:  Remove weapons (e.g., guns, rifles, knives), all items previously/currently identified as safety concern.    Remove drugs/medications (over-the-counter, prescriptions, illicit drugs), all items previously/currently identified as a safety concern.  The family member/significant other verbalizes understanding of the suicide prevention education information provided.  The family member/significant other agrees to remove the items of safety concern listed above.  Madelin Rear reports he does not have any questions or concerns regarding the patient discharging home. He reports the patient plans to stay with her parents temporarily, while he is out of town at a friend's funeral. Madelin Rear reports the patient's parents will pick her up at discharge.   CSW will continue to follow for an appropriate discharge.    Maeola Sarah 11/13/2019, 10:04 AM

## 2019-11-13 NOTE — Progress Notes (Signed)
Patient ID: Laura Lambert, female   DOB: 1995-09-20, 24 y.o.   MRN: [id number] RN met with pt and reviewed pt's discharge instructions.  Pt verbalized understanding of discharge instructions and pt did not have any questions. RN reviewed and provided copies of SRA, Transition Record and AVS.  RN returned pt's belongings to pt.  Pt denied SI/HI/AVH and voiced no concerns.  Pt was appreciative of the care pt received at [facility].  Patient discharged to the lobby where parents were waiting to take pt home.

## 2019-11-13 NOTE — Progress Notes (Signed)
  [facility] Adult Case Management Discharge Plan :  Will you be returning to the same living situation after discharge:  No. Patient is discharging home with her parents temporarily.  At discharge, do you have transportation home?: Yes,  patient's parents are picking her up Do you have the ability to pay for your medications: Yes,  [employer]  Release of information consent forms completed and in the chart;  Patient's signature needed at discharge.  Patient to Follow up at: Follow-up Information    Dr. Arlyn Leak. Call.   Why: Please call this provider to schedule an appointment for medication management. Contact information: [address], [place] [address]  Phone: [phone] Fax: [phone]       Counseling, Wynona Meals. Schedule an appointment as soon as possible for a visit.   Why: Please call to schedule an appointment with your therapist Franco Nones. Contact information: [address] [place] [address] [phone]           Next level of care provider has access to [facility] Link:yes  Safety Planning and Suicide Prevention discussed: Yes,  with the patient's fiance     Has patient been referred to the Quitline?: N/A patient is not a smoker  Patient has been referred for addiction treatment: N/A  Maeola Sarah, LCSWA 11/13/2019, 11:06 AM

## 2019-11-16 DIAGNOSIS — F603 Borderline personality disorder: Secondary | ICD-10-CM | POA: Diagnosis not present

## 2019-11-18 DIAGNOSIS — F2081 Schizophreniform disorder: Secondary | ICD-10-CM | POA: Diagnosis not present

## 2019-11-23 DIAGNOSIS — F603 Borderline personality disorder: Secondary | ICD-10-CM | POA: Diagnosis not present

## 2019-11-30 DIAGNOSIS — F603 Borderline personality disorder: Secondary | ICD-10-CM | POA: Diagnosis not present

## 2019-12-07 DIAGNOSIS — F23 Brief psychotic disorder: Secondary | ICD-10-CM | POA: Diagnosis not present

## 2019-12-22 DIAGNOSIS — F603 Borderline personality disorder: Secondary | ICD-10-CM | POA: Diagnosis not present

## 2020-01-22 DIAGNOSIS — F603 Borderline personality disorder: Secondary | ICD-10-CM | POA: Diagnosis not present

## 2020-02-01 DIAGNOSIS — F603 Borderline personality disorder: Secondary | ICD-10-CM | POA: Diagnosis not present

## 2020-02-08 DIAGNOSIS — F29 Unspecified psychosis not due to a substance or known physiological condition: Secondary | ICD-10-CM | POA: Diagnosis not present

## 2020-02-16 DIAGNOSIS — F603 Borderline personality disorder: Secondary | ICD-10-CM | POA: Diagnosis not present

## 2020-02-25 DIAGNOSIS — R030 Elevated blood-pressure reading, without diagnosis of hypertension: Secondary | ICD-10-CM | POA: Diagnosis not present

## 2020-02-25 DIAGNOSIS — Z20828 Contact with and (suspected) exposure to other viral communicable diseases: Secondary | ICD-10-CM | POA: Diagnosis not present

## 2020-02-27 ENCOUNTER — Ambulatory Visit (HOSPITAL_COMMUNITY)
Admission: AD | Admit: 2020-02-27 | Discharge: 2020-02-27 | Disposition: A | Discharge status: Home / Self Care | Payer: Federal, State, Local not specified - PPO | Attending: Psychiatry | Admitting: Psychiatry

## 2020-02-27 ENCOUNTER — Other Ambulatory Visit: Payer: Self-pay

## 2020-02-27 ENCOUNTER — Encounter (HOSPITAL_COMMUNITY): Payer: Self-pay | Admitting: Emergency Medicine

## 2020-02-27 ENCOUNTER — Ambulatory Visit (HOSPITAL_COMMUNITY)
Admission: EM | Admit: 2020-02-27 | Discharge: 2020-02-28 | Disposition: A | Discharge status: Home / Self Care | Payer: Federal, State, Local not specified - PPO | Attending: Behavioral Health | Admitting: Behavioral Health

## 2020-02-27 DIAGNOSIS — R4587 Impulsiveness: Secondary | ICD-10-CM | POA: Insufficient documentation

## 2020-02-27 DIAGNOSIS — F603 Borderline personality disorder: Secondary | ICD-10-CM

## 2020-02-27 DIAGNOSIS — F332 Major depressive disorder, recurrent severe without psychotic features: Secondary | ICD-10-CM | POA: Diagnosis not present

## 2020-02-27 DIAGNOSIS — Z20822 Contact with and (suspected) exposure to covid-19: Secondary | ICD-10-CM | POA: Insufficient documentation

## 2020-02-27 DIAGNOSIS — F419 Anxiety disorder, unspecified: Secondary | ICD-10-CM | POA: Diagnosis not present

## 2020-02-27 DIAGNOSIS — R450 Nervousness: Secondary | ICD-10-CM | POA: Insufficient documentation

## 2020-02-27 DIAGNOSIS — F329 Major depressive disorder, single episode, unspecified: Secondary | ICD-10-CM | POA: Diagnosis not present

## 2020-02-27 DIAGNOSIS — R45851 Suicidal ideations: Secondary | ICD-10-CM | POA: Diagnosis not present

## 2020-02-27 LAB — CBC WITH DIFFERENTIAL/PLATELET
Abs Immature Granulocytes: 0.04 10*3/uL (ref 0.00–0.07)
Basophils Absolute: 0 10*3/uL (ref 0.0–0.1)
Basophils Relative: 0 %
Eosinophils Absolute: 0.1 10*3/uL (ref 0.0–0.5)
Eosinophils Relative: 1 %
HCT: 39.1 % (ref 36.0–46.0)
Hemoglobin: 12.8 g/dL (ref 12.0–15.0)
Immature Granulocytes: 0 %
Lymphocytes Relative: 29 %
Lymphs Abs: 3.6 10*3/uL (ref 0.7–4.0)
MCH: 30.2 pg (ref 26.0–34.0)
MCHC: 32.7 g/dL (ref 30.0–36.0)
MCV: 92.2 fL (ref 80.0–100.0)
Monocytes Absolute: 0.6 10*3/uL (ref 0.1–1.0)
Monocytes Relative: 5 %
Neutro Abs: 8.2 10*3/uL — ABNORMAL HIGH (ref 1.7–7.7)
Neutrophils Relative %: 65 %
Platelets: 272 10*3/uL (ref 150–400)
RBC: 4.24 MIL/uL (ref 3.87–5.11)
RDW: 11.8 % (ref 11.5–15.5)
WBC: 12.5 10*3/uL — ABNORMAL HIGH (ref 4.0–10.5)
nRBC: 0 % (ref 0.0–0.2)

## 2020-02-27 LAB — POC SARS CORONAVIRUS 2 AG -  ED: SARS Coronavirus 2 Ag: NEGATIVE

## 2020-02-27 LAB — POCT URINE DRUG SCREEN - MANUAL ENTRY (I-SCREEN)
POC Amphetamine UR: NOT DETECTED
POC Buprenorphine (BUP): NOT DETECTED
POC Cocaine UR: NOT DETECTED
POC Marijuana UR: POSITIVE — AB
POC Methadone UR: NOT DETECTED
POC Methamphetamine UR: NOT DETECTED
POC Morphine: NOT DETECTED
POC Oxazepam (BZO): NOT DETECTED
POC Oxycodone UR: NOT DETECTED
POC Secobarbital (BAR): NOT DETECTED

## 2020-02-27 LAB — POCT PREGNANCY, URINE: Preg Test, Ur: NEGATIVE

## 2020-02-27 MED ORDER — ARIPIPRAZOLE 10 MG PO TABS
20.0000 mg | ORAL_TABLET | Freq: Every day | ORAL | Status: DC
  Administered 2020-02-27: 20 mg via ORAL
  Filled 2020-02-27: qty 2

## 2020-02-27 MED ORDER — TRAZODONE HCL 50 MG PO TABS
50.0000 mg | ORAL_TABLET | Freq: Every evening | ORAL | Status: DC | PRN
  Administered 2020-02-27: 50 mg via ORAL
  Filled 2020-02-27: qty 1

## 2020-02-27 MED ORDER — ACETAMINOPHEN 325 MG PO TABS: 650.0000 mg | ORAL_TABLET | Freq: Four times a day (QID) | ORAL | Status: DC | PRN

## 2020-02-27 MED ORDER — MAGNESIUM HYDROXIDE 400 MG/5ML PO SUSP: 30.0000 mL | Freq: Every day | ORAL | Status: DC | PRN

## 2020-02-27 MED ORDER — SERTRALINE HCL 25 MG PO TABS
25.0000 mg | ORAL_TABLET | Freq: Every day | ORAL | Status: DC
  Administered 2020-02-27: 25 mg via ORAL
  Filled 2020-02-27: qty 1

## 2020-02-27 MED ORDER — ALUM & MAG HYDROXIDE-SIMETH 200-200-20 MG/5ML PO SUSP: 30.0000 mL | ORAL | Status: DC | PRN

## 2020-02-27 NOTE — ED Provider Notes (Signed)
Behavioral Health Admission H&P [facility] & OBS)  Date: 02/27/20 Patient Name: Laura Lambert MRN: [id number] Chief Complaint: No chief complaint on file.     Diagnoses:  Final diagnoses:  None    HPI:   PHQ 2-9:     Ad[facility] ([facility]) [facility] from 11/11/2019 in [facility]  C-SSRS RISK CATEGORY Error: Q3, 4, or 5 should not be populated when Q2 is No High Risk       Total Time spent with patient: 20 minutes  Musculoskeletal  Strength & Muscle Tone: within normal limits Gait & Station: normal Patient leans: N/A  Psychiatric Specialty Exam  Presentation General Appearance: Appropriate for Environment  Eye Contact:Good  Speech:Clear and Coherent  Speech Volume:Normal  Handedness:Right   Mood and Affect  Mood:Anxious  Affect:Congruent   Thought Process  Thought Processes:Coherent  Descriptions of Associations:Intact  Orientation:Full (Time, Place and Person)  Thought Content:Logical  Hallucinations:Hallucinations: None  Ideas of Reference:None  Suicidal Thoughts:Suicidal Thoughts: Yes, Passive SI Passive Intent and/or Plan: Without Intent;Without Plan  Homicidal Thoughts:Homicidal Thoughts: No   Sensorium  Memory:Immediate Good;Recent Good;Remote Good  Judgment:Fair  Insight:Fair   Executive Functions  Concentration:Good  Attention Span:Good  Recall:Good  Fund of Knowledge:Good  Language:Good   Psychomotor Activity  Psychomotor Activity:Psychomotor Activity: Normal   Assets  Assets:Intimacy;Resilience;Social Support   Sleep  Sleep:Sleep: Good Number of Hours of Sleep: 8   Physical Exam Vitals and nursing note reviewed.  Constitutional:      Appearance: Normal appearance.  HENT:     Right Ear: Tympanic membrane normal.     Left Ear: Tympanic membrane normal.     Nose: Nose normal.  Musculoskeletal:     Cervical back:  Normal range of motion and neck supple.  Neurological:     Mental Status: She is alert.    ROS  Blood pressure (!) 107/52, pulse (!) 55, temperature 98.8 F (37.1 C), resp. rate 16. There is no height or weight on file to calculate BMI.  Past Psychiatric History:   Is the patient at risk to self? No  Has the patient been a risk to self in the past 6 months? No .    Has the patient been a risk to self within the distant past? No   Is the patient a risk to others? No   Has the patient been a risk to others in the past 6 months? No   Has the patient been a risk to others within the distant past? No   Past Medical History: No past medical history on file.  Past Surgical History:  Procedure Laterality Date  . DENTAL SURGERY      Family History:  Family History  Problem Relation Age of Onset  . Cancer Other   . Hyperlipidemia Other   . Hypertension Other   . Diabetes Other   . Heart attack Other   . Hypertension Father   . Depression Maternal Grandmother     Social History:  Social History   Socioeconomic History  . Marital status: Single    Spouse name: Not on file  . Number of children: Not on file  . Years of education: Not on file  . Highest education level: Not on file  Occupational History  . Not on file  Tobacco Use  . Smoking status: Never Smoker  . Smokeless tobacco: Never Used  Substance and Sexual Activity  . Alcohol use: Yes  Comment: Occ  . Drug use: Yes    Types: Marijuana  . Sexual activity: Not on file  Other Topics Concern  . Not on file  Social History Narrative  . Not on file   Social Determinants of Health   Financial Resource Strain:   . Difficulty of Paying Living Expenses: Not on file  Food Insecurity:   . Worried About Programme researcher, broadcasting/film/videounning Out of Food in the Last Year: Not on file  . Ran Out of Food in the Last Year: Not on file  Transportation Needs:   . Lack of Transportation (Medical): Not on file  . Lack of Transportation  (Non-Medical): Not on file  Physical Activity:   . Days of Exercise per Week: Not on file  . Minutes of Exercise per Session: Not on file  Stress:   . Feeling of Stress : Not on file  Social Connections:   . Frequency of Communication with Friends and Family: Not on file  . Frequency of Social Gatherings with Friends and Family: Not on file  . Attends Religious Services: Not on file  . Active Member of Clubs or Organizations: Not on file  . Attends BankerClub or Organization Meetings: Not on file  . Marital Status: Not on file  Intimate Partner Violence:   . Fear of Current or Ex-Partner: Not on file  . Emotionally Abused: Not on file  . Physically Abused: Not on file  . Sexually Abused: Not on file    SDOH:  SDOH Screenings   Alcohol Screen: Low Risk   . Last Alcohol Screening Score (AUDIT): 1  Depression (PHQ2-9):   . PHQ-2 Score: Not on file  Financial Resource Strain:   . Difficulty of Paying Living Expenses: Not on file  Food Insecurity:   . Worried About Programme researcher, broadcasting/film/videounning Out of Food in the Last Year: Not on file  . Ran Out of Food in the Last Year: Not on file  Housing:   . Last Housing Risk Score: Not on file  Physical Activity:   . Days of Exercise per Week: Not on file  . Minutes of Exercise per Session: Not on file  Social Connections:   . Frequency of Communication with Friends and Family: Not on file  . Frequency of Social Gatherings with Friends and Family: Not on file  . Attends Religious Services: Not on file  . Active Member of Clubs or Organizations: Not on file  . Attends BankerClub or Organization Meetings: Not on file  . Marital Status: Not on file  Stress:   . Feeling of Stress : Not on file  Tobacco Use: Low Risk   . Smoking Tobacco Use: Never Smoker  . Smokeless Tobacco Use: Never Used  Transportation Needs:   . Freight forwarderLack of Transportation (Medical): Not on file  . Lack of Transportation (Non-Medical): Not on file    Last Labs:  Admission on 11/11/2019, Discharged  on 11/12/2019  Component Date Value Ref Range Status  . Sodium 11/11/2019 141  135 - 145 mmol/L Final  . Potassium 11/11/2019 3.7  3.5 - 5.1 mmol/L Final  . Chloride 11/11/2019 108  98 - 111 mmol/L Final  . CO2 11/11/2019 26  22 - 32 mmol/L Final  . Glucose, Bld 11/11/2019 90  70 - 99 mg/dL Final   Glucose reference range applies only to samples taken after fasting for at least 8 hours.  . BUN 11/11/2019 14  6 - 20 mg/dL Final  . Creatinine, Ser 11/11/2019 0.71  0.44 - 1.00  mg/dL Final  . Calcium 18/84/1660 8.8* 8.9 - 10.3 mg/dL Final  . Total Protein 11/11/2019 6.8  6.5 - 8.1 g/dL Final  . Albumin 63/06/6008 3.8  3.5 - 5.0 g/dL Final  . AST 93/23/5573 14* 15 - 41 U/L Final  . ALT 11/11/2019 13  0 - 44 U/L Final  . Alkaline Phosphatase 11/11/2019 56  38 - 126 U/L Final  . Total Bilirubin 11/11/2019 0.4  0.3 - 1.2 mg/dL Final  . GFR calc non Af Amer 11/11/2019 >60  >60 mL/min Final  . GFR calc Af Amer 11/11/2019 >60  >60 mL/min Final  . Anion gap 11/11/2019 7  5 - 15 Final   Performed at [facility], [address], [place], [place] [address]  . Alcohol, Ethyl (B) 11/11/2019 <10  <10 mg/dL Final   Comment: (NOTE) Lowest detectable limit for serum alcohol is 10 mg/dL. For medical purposes only. Performed at [facility], [address], [place], [place] [address]   . Salicylate Lvl 11/11/2019 <7.0* 7.0 - 30.0 mg/dL Final   Performed at [facility], [address], [place], [place] [address]  . Acetaminophen (Tylenol), Serum 11/11/2019 <10* 10 - 30 ug/mL Final   Comment: (NOTE) Therapeutic concentrations vary significantly. A range of 10-30 ug/mL  may be an effective concentration for many patients. However, some  are best treated at concentrations outside of this range. Acetaminophen concentrations >150 ug/mL at 4 hours after ingestion  and >50 ug/mL at 12 hours after ingestion are often associated with  toxic  reactions. Performed at [facility], [address], [place], [place] [address]   . WBC 11/11/2019 7.4  4.0 - 10.5 K/uL Final  . RBC 11/11/2019 4.03  3.87 - 5.11 MIL/uL Final  . Hemoglobin 11/11/2019 13.0  12.0 - 15.0 g/dL Final  . HCT 76/16/0737 38.6  36 - 46 % Final  . MCV 11/11/2019 95.8  80.0 - 100.0 fL Final  . MCH 11/11/2019 32.3  26.0 - 34.0 pg Final  . MCHC 11/11/2019 33.7  30.0 - 36.0 g/dL Final  . RDW 10/62/6948 11.9  11.5 - 15.5 % Final  . Platelets 11/11/2019 286  150 - 400 K/uL Final  . nRBC 11/11/2019 0.0  0.0 - 0.2 % Final   Performed at [facility], [address] [place], [place] [address]  . Opiates 11/11/2019 NONE DETECTED  NONE DETECTED Final  . Cocaine 11/11/2019 NONE DETECTED  NONE DETECTED Final  . Benzodiazepines 11/11/2019 POSITIVE* NONE DETECTED Final  . Amphetamines 11/11/2019 NONE DETECTED  NONE DETECTED Final  . Tetrahydrocannabinol 11/11/2019 POSITIVE* NONE DETECTED Final  . Barbiturates 11/11/2019 NONE DETECTED  NONE DETECTED Final   Comment: (NOTE) DRUG SCREEN FOR MEDICAL PURPOSES ONLY.  IF CONFIRMATION IS NEEDED FOR ANY PURPOSE, NOTIFY LAB WITHIN 5 DAYS. LOWEST DETECTABLE LIMITS FOR URINE DRUG SCREEN Drug Class                     Cutoff (ng/mL) Amphetamine and metabolites    1000 Barbiturate and metabolites    200 Benzodiazepine                 200 Tricyclics and metabolites     300 Opiates and metabolites        300 Cocaine and metabolites        300 THC  50 Performed at [facility], [address] [place], [place] [address]   . I-stat hCG, quantitative 11/11/2019 <5.0  <5 mIU/mL Final  . Comment 3 11/11/2019          Final   Comment:   GEST. AGE      CONC.  (mIU/mL)   <=1 WEEK        5 - 50     2 WEEKS       50 - 500     3 WEEKS       100 - 10,000     4 WEEKS     1,000 - 30,000        FEMALE AND NON-PREGNANT FEMALE:     LESS THAN 5 mIU/mL   .  Acetaminophen (Tylenol), Serum 11/11/2019 <10* 10 - 30 ug/mL Final   Comment: (NOTE) Therapeutic concentrations vary significantly. A range of 10-30 ug/mL  may be an effective concentration for many patients. However, some  are best treated at concentrations outside of this range. Acetaminophen concentrations >150 ug/mL at 4 hours after ingestion  and >50 ug/mL at 12 hours after ingestion are often associated with  toxic reactions. Performed at [facility], [address] [place], [place] [address]   . SARS Coronavirus 2 11/11/2019 NEGATIVE  NEGATIVE Final   Comment: (NOTE) SARS-CoV-2 target nucleic acids are NOT DETECTED. The SARS-CoV-2 RNA is generally detectable in upper and lower respiratory specimens during the acute phase of infection. The lowest concentration of SARS-CoV-2 viral copies this assay can detect is 250 copies / mL. A negative result does not preclude SARS-CoV-2 infection and should not be used as the sole basis for treatment or other patient management decisions.  A negative result may occur with improper specimen collection / handling, submission of specimen other than nasopharyngeal swab, presence of viral mutation(s) within the areas targeted by this assay, and inadequate number of viral copies (<250 copies / mL). A negative result must be combined with clinical observations, patient history, and epidemiological information. Fact Sheet for Patients:   BoilerBrush.com.cy Fact Sheet for Healthcare Providers: https://pope.com/ This test is not yet approved or cleared                           by the [place] FDA and has been authorized for detection and/or diagnosis of SARS-CoV-2 by FDA under an Emergency Use Authorization (EUA).  This EUA will remain in effect (meaning this test can be used) for the duration of the COVID-19 declaration under Section 564(b)(1) of the Act, 21 U.S.C. section  360bbb-3(b)(1), unless the authorization is terminated or revoked sooner. Performed at [facility], [address], [place], [place] [address]     Allergies: Patient has no known allergies.  PTA Medications: (Not in a hospital admission)   Medical Decision Making    Recommendations  Based on my evaluation the patient does not appear to have an emergency medical condition.  Gillermo Murdoch, NP 02/27/20  9:12 PM

## 2020-02-27 NOTE — ED Triage Notes (Signed)
Presents with SI, plan to overdose on meds.

## 2020-02-27 NOTE — H&P (Signed)
Behavioral Health Medical Screening Exam  Laura Lambert is an 24 y.o. female.  Total Time spent with patient: 45 minutes  Psychiatric Specialty Exam: Physical Exam Vitals and nursing note reviewed.  Constitutional:      Appearance: Normal appearance.  HENT:     Head: Normocephalic.     Nose: Nose normal.  Pulmonary:     Effort: Pulmonary effort is normal.  Musculoskeletal:        General: Normal range of motion.     Cervical back: Normal range of motion.  Neurological:     General: No focal deficit present.     Mental Status: She is alert and oriented to person, place, and time.  Psychiatric:        Attention and Perception: Attention and perception normal.        Mood and Affect: Mood is anxious and depressed.        Speech: Speech normal.        Behavior: Behavior normal. Behavior is cooperative.        Thought Content: Thought content includes suicidal ideation. Thought content includes suicidal plan.        Cognition and Memory: Cognition and memory normal.        Judgment: Judgment is impulsive.    Review of Systems  Psychiatric/Behavioral: Positive for dysphoric mood and suicidal ideas. The patient is nervous/anxious.   All other systems reviewed and are negative.  There were no vitals taken for this visit.There is no height or weight on file to calculate BMI. General Appearance: Casual Eye Contact:  Good Speech:  Normal Rate Volume:  Normal Mood:  Depressed Affect:  Congruent Thought Process:  Coherent and Descriptions of Associations: Intact Orientation:  Full (Time, Place, and Person) Thought Content:  WDL and Logical Suicidal Thoughts:  Yes.  with intent/plan Homicidal Thoughts:  No Memory:  Immediate;   Fair Recent;   Fair Remote;   Fair Judgement:  Fair Insight:  Fair Psychomotor Activity:  Normal Concentration: Concentration: Fair and Attention Span: Fair Recall:  [employer] of Knowledge:Good Language: Fair Akathisia:  No Handed:   Right AIMS (if indicated):    Assets:  Housing Leisure Time Physical Health Resilience Sleep:     Musculoskeletal: Strength & Muscle Tone: within normal limits Gait & Station: normal Patient leans: N/A  There were no vitals taken for this visit.  Recommendations: Based on my evaluation the patient does not appear to have an emergency medical condition.  Nanine Means, NP 02/27/2020, 6:04 PM

## 2020-02-27 NOTE — ED Notes (Signed)
Pt A&O x 4, presents with SI, plan to overdose on pills.  Stressor of breakup with boyfriend.  Denies HI or AVH.  Pt calm & cooperative, no distress noted, monitoring for safety.
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
## 2020-02-27 NOTE — ED Provider Notes (Signed)
Behavioral Health Admission H&P [facility])  Date: 02/27/20 Patient Name: Laura Lambert MRN: [id number] Chief Complaint:  Chief Complaint  Patient presents with  . Suicidal      Diagnoses:  Final diagnoses:  None   HPI: Per TTS counselor: Laura Lambert is an 24 y.o. female with a history of Borderline Personality Disorder and GAD who presents voluntarily to [facility] requesting inpatient admission due to recent onset of SI with a plan.  Patient states she had a panic attack on her lunch break today, and she was unable to return to complete her shift.  Patient states she began feeling hopeless, thinking she "would rather be dead than to do this every day."  She currently works full time at [employer].  She shares that she has a Master's degree in [employer] and has yet to get a job in her field due to substance use issues.  She reports using THC, with last use 3 months ago.  She admits to using Delta 8 a month ago and she continues to test positive on home drug screening tests.  She also reports her current relationship of 4 years is strained, after she had an affair 1.5 months ago. She has requested to go back to couples counseling, however her boyfriend has said he isn't ready yet.  Patient continues to endorse SI with a plan to overdose on her prescribed trazadone.  She has a history of 2 attempts, with most recent being in May 2021 after which she was hospitalized at [facility].  The other attempt was by cutting at age 3.  PHQ 2-9:     [facility]) [facility] from [facility]11[facility]  C-SSRS RISK CATEGORY Error: Q3, 4, or 5 should not be populated when Q2 is No High Risk       Total Time spent with patient: 20 minutes  Musculoskeletal  Strength & Muscle Tone: within normal limits Gait & Station: normal Patient leans: N/A  Psychiatric Specialty Exam   Presentation General Appearance: Appropriate for Environment  Eye Contact:Good  Speech:Clear and Coherent  Speech Volume:Normal  Handedness:Right   Mood and Affect  Mood:Anxious  Affect:Congruent   Thought Process  Thought Processes:Coherent  Descriptions of Associations:Intact  Orientation:Full (Time, Place and Person)  Thought Content:Logical  Hallucinations:Hallucinations: None  Ideas of Reference:None  Suicidal Thoughts:Suicidal Thoughts: Yes, Passive SI Passive Intent and/or Plan: Without Intent;Without Plan  Homicidal Thoughts:Homicidal Thoughts: No   Sensorium  Memory:Immediate Good;Recent Good;Remote Good  Judgment:Fair  Insight:Fair   Executive Functions  Concentration:Good  Attention Span:Good  Recall:Good  Fund of Knowledge:Good  Language:Good   Psychomotor Activity  Psychomotor Activity:Psychomotor Activity: Normal   Assets  Assets:Intimacy;Resilience;Social Support   Sleep  Sleep:Sleep: Good Number of Hours of Sleep: 8   Physical Exam ROS  Blood pressure (!) 107/52, pulse (!) 55, temperature 98.8 F (37.1 C), resp. rate 16. There is no height or weight on file to calculate BMI.  Past Psychiatric History:   Is the patient at risk to self? Yes  Has the patient been a risk to self in the past 6 months? Yes .    Has the patient been a risk to self within the distant past? Yes   Is the patient a risk to others? No   Has the patient been a risk to others in the past 6 months? No   Has the patient been a risk to others within  the distant past? No   Past Medical History: No past medical history on file.  Past Surgical History:  Procedure Laterality Date  . DENTAL SURGERY      Family History:  Family History  Problem Relation Age of Onset  . Cancer Other   . Hyperlipidemia Other   . Hypertension Other   . Diabetes Other   . Heart attack Other   . Hypertension Father   . Depression Maternal Grandmother      Social History:  Social History   Socioeconomic History  . Marital status: Single    Spouse name: Not on file  . Number of children: Not on file  . Years of education: Not on file  . Highest education level: Not on file  Occupational History  . Not on file  Tobacco Use  . Smoking status: Never Smoker  . Smokeless tobacco: Never Used  Substance and Sexual Activity  . Alcohol use: Yes    Comment: Occ  . Drug use: Yes    Types: Marijuana  . Sexual activity: Not on file  Other Topics Concern  . Not on file  Social History Narrative  . Not on file   Social Determinants of Health   Financial Resource Strain:   . Difficulty of Paying Living Expenses: Not on file  Food Insecurity:   . Worried About Programme researcher, broadcasting/film/videounning Out of Food in the Last Year: Not on file  . Ran Out of Food in the Last Year: Not on file  Transportation Needs:   . Lack of Transportation (Medical): Not on file  . Lack of Transportation (Non-Medical): Not on file  Physical Activity:   . Days of Exercise per Week: Not on file  . Minutes of Exercise per Session: Not on file  Stress:   . Feeling of Stress : Not on file  Social Connections:   . Frequency of Communication with Friends and Family: Not on file  . Frequency of Social Gatherings with Friends and Family: Not on file  . Attends Religious Services: Not on file  . Active Member of Clubs or Organizations: Not on file  . Attends BankerClub or Organization Meetings: Not on file  . Marital Status: Not on file  Intimate Partner Violence:   . Fear of Current or Ex-Partner: Not on file  . Emotionally Abused: Not on file  . Physically Abused: Not on file  . Sexually Abused: Not on file    SDOH:  SDOH Screenings   Alcohol Screen: Low Risk   . Last Alcohol Screening Score (AUDIT): 1  Depression (PHQ2-9):   . PHQ-2 Score: Not on file  Financial Resource Strain:   . Difficulty of Paying Living Expenses: Not on file  Food Insecurity:   . Worried About Patent examinerunning Out  of Food in the Last Year: Not on file  . Ran Out of Food in the Last Year: Not on file  Housing:   . Last Housing Risk Score: Not on file  Physical Activity:   . Days of Exercise per Week: Not on file  . Minutes of Exercise per Session: Not on file  Social Connections:   . Frequency of Communication with Friends and Family: Not on file  . Frequency of Social Gatherings with Friends and Family: Not on file  . Attends Religious Services: Not on file  . Active Member of Clubs or Organizations: Not on file  . Attends BankerClub or Organization Meetings: Not on file  . Marital Status: Not on file  Stress:   .  Feeling of Stress : Not on file  Tobacco Use: Low Risk   . Smoking Tobacco Use: Never Smoker  . Smokeless Tobacco Use: Never Used  Transportation Needs:   . Freight forwarder (Medical): Not on file  . Lack of Transportation (Non-Medical): Not on file    Last Labs:  Admission on 02/27/2020  Component Date Value Ref Range Status  . POC Amphetamine UR 02/27/2020 None Detected  None Detected Final  . POC Secobarbital (BAR) 02/27/2020 None Detected  None Detected Final  . POC Buprenorphine (BUP) 02/27/2020 None Detected  None Detected Final  . POC Oxazepam (BZO) 02/27/2020 None Detected  None Detected Final  . POC Cocaine UR 02/27/2020 None Detected  None Detected Final  . POC Methamphetamine UR 02/27/2020 None Detected  None Detected Final  . POC Morphine 02/27/2020 None Detected  None Detected Final  . POC Oxycodone UR 02/27/2020 None Detected  None Detected Final  . POC Methadone UR 02/27/2020 None Detected  None Detected Final  . POC Marijuana UR 02/27/2020 Positive* None Detected Final  . Preg Test, Ur 02/27/2020 NEGATIVE  NEGATIVE Final   Comment:        THE SENSITIVITY OF THIS METHODOLOGY IS >24 mIU/mL   Admission on 11/11/2019, Discharged on 11/12/2019  Component Date Value Ref Range Status  . Sodium 11/11/2019 141  135 - 145 mmol/L Final  . Potassium 11/11/2019 3.7   3.5 - 5.1 mmol/L Final  . Chloride 11/11/2019 108  98 - 111 mmol/L Final  . CO2 11/11/2019 26  22 - 32 mmol/L Final  . Glucose, Bld 11/11/2019 90  70 - 99 mg/dL Final   Glucose reference range applies only to samples taken after fasting for at least 8 hours.  . BUN 11/11/2019 14  6 - 20 mg/dL Final  . Creatinine, Ser 11/11/2019 0.71  0.44 - 1.00 mg/dL Final  . Calcium 40/01/6760 8.8* 8.9 - 10.3 mg/dL Final  . Total Protein 11/11/2019 6.8  6.5 - 8.1 g/dL Final  . Albumin 95/02/3266 3.8  3.5 - 5.0 g/dL Final  . AST 12/45/8099 14* 15 - 41 U/L Final  . ALT 11/11/2019 13  0 - 44 U/L Final  . Alkaline Phosphatase 11/11/2019 56  38 - 126 U/L Final  . Total Bilirubin 11/11/2019 0.4  0.3 - 1.2 mg/dL Final  . GFR calc non Af Amer 11/11/2019 >60  >60 mL/min Final  . GFR calc Af Amer 11/11/2019 >60  >60 mL/min Final  . Anion gap 11/11/2019 7  5 - 15 Final   Performed at [facility], [address], [place], [place] [address]  . Alcohol, Ethyl (B) 11/11/2019 <10  <10 mg/dL Final   Comment: (NOTE) Lowest detectable limit for serum alcohol is 10 mg/dL. For medical purposes only. Performed at [facility], [address], [place], [place] [address]   . Salicylate Lvl 11/11/2019 <7.0* 7.0 - 30.0 mg/dL Final   Performed at [facility], [address] [place], [place] [address]  . Acetaminophen (Tylenol), Serum 11/11/2019 <10* 10 - 30 ug/mL Final   Comment: (NOTE) Therapeutic concentrations vary significantly. A range of 10-30 ug/mL  may be an effective concentration for many patients. However, some  are best treated at concentrations outside of this range. Acetaminophen concentrations >150 ug/mL at 4 hours after ingestion  and >50 ug/mL at 12 hours after ingestion are often associated with  toxic reactions. Performed at [facility], [address]  [place], [place] [address]   . WBC 11/11/2019 7.4  4.0 - 10.5 K/uL  Final  . RBC 11/11/2019 4.03  3.87 - 5.11 MIL/uL Final  . Hemoglobin 11/11/2019 13.0  12.0 - 15.0 g/dL Final  . HCT 60/45/4098 38.6  36 - 46 % Final  . MCV 11/11/2019 95.8  80.0 - 100.0 fL Final  . MCH 11/11/2019 32.3  26.0 - 34.0 pg Final  . MCHC 11/11/2019 33.7  30.0 - 36.0 g/dL Final  . RDW 11/91/4782 11.9  11.5 - 15.5 % Final  . Platelets 11/11/2019 286  150 - 400 K/uL Final  . nRBC 11/11/2019 0.0  0.0 - 0.2 % Final   Performed at [facility], [address] [place], [place] [address]  . Opiates 11/11/2019 NONE DETECTED  NONE DETECTED Final  . Cocaine 11/11/2019 NONE DETECTED  NONE DETECTED Final  . Benzodiazepines 11/11/2019 POSITIVE* NONE DETECTED Final  . Amphetamines 11/11/2019 NONE DETECTED  NONE DETECTED Final  . Tetrahydrocannabinol 11/11/2019 POSITIVE* NONE DETECTED Final  . Barbiturates 11/11/2019 NONE DETECTED  NONE DETECTED Final   Comment: (NOTE) DRUG SCREEN FOR MEDICAL PURPOSES ONLY.  IF CONFIRMATION IS NEEDED FOR ANY PURPOSE, NOTIFY LAB WITHIN 5 DAYS. LOWEST DETECTABLE LIMITS FOR URINE DRUG SCREEN Drug Class                     Cutoff (ng/mL) Amphetamine and metabolites    1000 Barbiturate and metabolites    200 Benzodiazepine                 200 Tricyclics and metabolites     300 Opiates and metabolites        300 Cocaine and metabolites        300 THC                            50 Performed at [facility], [address], [place], [place] [address]   . I-stat hCG, quantitative 11/11/2019 <5.0  <5 mIU/mL Final  . Comment 3 11/11/2019          Final   Comment:   GEST. AGE      CONC.  (mIU/mL)   <=1 WEEK        5 - 50     2 WEEKS       50 - 500     3 WEEKS       100 - 10,000     4 WEEKS     1,000 - 30,000        FEMALE AND NON-PREGNANT FEMALE:     LESS THAN 5 mIU/mL   . Acetaminophen (Tylenol), Serum 11/11/2019 <10* 10 - 30 ug/mL Final   Comment: (NOTE) Therapeutic concentrations vary significantly. A range of  10-30 ug/mL  may be an effective concentration for many patients. However, some  are best treated at concentrations outside of this range. Acetaminophen concentrations >150 ug/mL at 4 hours after ingestion  and >50 ug/mL at 12 hours after ingestion are often associated with  toxic reactions. Performed at [facility], [address] [place], [place] [address]   . SARS Coronavirus 2 11/11/2019 NEGATIVE  NEGATIVE Final   Comment: (NOTE) SARS-CoV-2 target nucleic acids are NOT DETECTED. The SARS-CoV-2 RNA is generally detectable in upper and lower respiratory specimens during the acute phase of infection. The lowest concentration of SARS-CoV-2 viral copies this assay can detect is  250 copies / mL. A negative result does not preclude SARS-CoV-2 infection and should not be used as the sole basis for treatment or other patient management decisions.  A negative result may occur with improper specimen collection / handling, submission of specimen other than nasopharyngeal swab, presence of viral mutation(s) within the areas targeted by this assay, and inadequate number of viral copies (<250 copies / mL). A negative result must be combined with clinical observations, patient history, and epidemiological information. Fact Sheet for Patients:   BoilerBrush.com.cy Fact Sheet for Healthcare Providers: https://pope.com/ This test is not yet approved or cleared                           by the [place] FDA and has been authorized for detection and/or diagnosis of SARS-CoV-2 by FDA under an Emergency Use Authorization (EUA).  This EUA will remain in effect (meaning this test can be used) for the duration of the COVID-19 declaration under Section 564(b)(1) of the Act, 21 U.S.C. section 360bbb-3(b)(1), unless the authorization is terminated or revoked sooner. Performed at [facility], [address] [place], [place] [address]     Allergies: Patient has no known allergies.  PTA Medications: (Not in a hospital admission)   Medical Decision Making  The patient is a safety risk to self and others and currently is requiring psychiatric inpatient admission for stabilization and treatment.    Recommendations  Based on my evaluation the patient appears to have an emergency medical condition for which I recommend the patient be transferred to the emergency department for further evaluation.  Gillermo Murdoch, NP 02/27/20  10:58 PM

## 2020-02-28 LAB — COMPREHENSIVE METABOLIC PANEL
ALT: 23 U/L (ref 0–44)
AST: 16 U/L (ref 15–41)
Albumin: 3.8 g/dL (ref 3.5–5.0)
Alkaline Phosphatase: 65 U/L (ref 38–126)
Anion gap: 10 (ref 5–15)
BUN: 14 mg/dL (ref 6–20)
CO2: 25 mmol/L (ref 22–32)
Calcium: 9.3 mg/dL (ref 8.9–10.3)
Chloride: 103 mmol/L (ref 98–111)
Creatinine, Ser: 0.68 mg/dL (ref 0.44–1.00)
GFR calc Af Amer: >60 mL/min (ref >60)
GFR calc non Af Amer: >60 mL/min (ref >60)
Glucose, Bld: 91 mg/dL (ref 70–99)
Potassium: 3.9 mmol/L (ref 3.5–5.1)
Sodium: 138 mmol/L (ref 135–145)
Total Bilirubin: 0.5 mg/dL (ref 0.3–1.2)
Total Protein: 6.9 g/dL (ref 6.5–8.1)

## 2020-02-28 LAB — ETHANOL: Alcohol, Ethyl (B): <10 mg/dL (ref <10)

## 2020-02-28 LAB — LIPID PANEL
Cholesterol: 180 mg/dL (ref 0–200)
HDL: 49 mg/dL (ref >40)
LDL Cholesterol: 118 mg/dL — ABNORMAL HIGH (ref 0–99)
Total CHOL/HDL Ratio: 3.7 RATIO
Triglycerides: 65 mg/dL (ref <150)
VLDL: 13 mg/dL (ref 0–40)

## 2020-02-28 LAB — MAGNESIUM: Magnesium: 2.1 mg/dL (ref 1.7–2.4)

## 2020-02-28 LAB — SARS CORONAVIRUS 2 BY RT PCR (HOSPITAL ORDER, PERFORMED IN ~~LOC~~ HOSPITAL LAB): SARS Coronavirus 2: NEGATIVE

## 2020-02-28 LAB — TSH: TSH: 7.015 u[IU]/mL — ABNORMAL HIGH (ref 0.350–4.500)

## 2020-02-28 NOTE — ED Notes (Signed)
D: Pt alert and oriented on the unit.   A: Education, support, and encouragement provided. Discharge summary, medications and follow up appointments reviewed with pt. Suicide prevention resources provided. Pt's belongings in lockerreturned and belongings sheet signed.  R: Pt denies SI/HI, A/VH, pain, or any concerns at this time. Pt ambulatory on and off unit. Pt discharged to lobby. 

## 2020-02-28 NOTE — ED Notes (Signed)
Patient has been on phone and sitting on bed with eyes opened. No distress. Monitoring continues.

## 2020-02-28 NOTE — ED Notes (Signed)
Patient offered breakfast; given yogurt, muffin, and juice.

## 2020-02-28 NOTE — ED Notes (Signed)
Pt sleeping at present, no distress noted, monitoring for safety. 

## 2020-02-28 NOTE — ED Notes (Signed)
Patient is alert and tearful this morning. Patient denies SI/HI. Patient given support and encouragement. Monitoring continues.
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
## 2020-02-29 LAB — PROLACTIN: Prolactin: 5.3 ng/mL (ref 4.8–23.3)

## 2020-03-01 DIAGNOSIS — F603 Borderline personality disorder: Secondary | ICD-10-CM | POA: Diagnosis not present

## 2020-03-09 DIAGNOSIS — F29 Unspecified psychosis not due to a substance or known physiological condition: Secondary | ICD-10-CM | POA: Diagnosis not present

## 2020-03-14 ENCOUNTER — Encounter (HOSPITAL_COMMUNITY): Payer: Self-pay | Admitting: Emergency Medicine

## 2020-03-14 ENCOUNTER — Other Ambulatory Visit: Payer: Self-pay

## 2020-03-14 ENCOUNTER — Emergency Department (EMERGENCY_DEPARTMENT_HOSPITAL)
Admission: EM | Admit: 2020-03-14 | Discharge: 2020-03-15 | Disposition: A | Discharge status: Other Acute Inpatient Hospital | Payer: Federal, State, Local not specified - PPO | Source: Home / Self Care | Attending: Emergency Medicine | Admitting: Emergency Medicine

## 2020-03-14 ENCOUNTER — Ambulatory Visit (HOSPITAL_COMMUNITY)
Admission: AD | Admit: 2020-03-14 | Discharge: 2020-03-14 | Disposition: A | Discharge status: Home / Self Care | Payer: Federal, State, Local not specified - PPO | Attending: Psychiatry | Admitting: Psychiatry

## 2020-03-14 DIAGNOSIS — F332 Major depressive disorder, recurrent severe without psychotic features: Secondary | ICD-10-CM | POA: Diagnosis present

## 2020-03-14 DIAGNOSIS — T50901A Poisoning by unspecified drugs, medicaments and biological substances, accidental (unintentional), initial encounter: Secondary | ICD-10-CM | POA: Diagnosis not present

## 2020-03-14 DIAGNOSIS — F603 Borderline personality disorder: Secondary | ICD-10-CM | POA: Diagnosis present

## 2020-03-14 DIAGNOSIS — R45851 Suicidal ideations: Secondary | ICD-10-CM | POA: Diagnosis not present

## 2020-03-14 DIAGNOSIS — O99341 Other mental disorders complicating pregnancy, first trimester: Secondary | ICD-10-CM | POA: Diagnosis not present

## 2020-03-14 DIAGNOSIS — F411 Generalized anxiety disorder: Secondary | ICD-10-CM | POA: Diagnosis present

## 2020-03-14 DIAGNOSIS — Z915 Personal history of self-harm: Secondary | ICD-10-CM | POA: Diagnosis not present

## 2020-03-14 DIAGNOSIS — Z79899 Other long term (current) drug therapy: Secondary | ICD-10-CM | POA: Diagnosis not present

## 2020-03-14 DIAGNOSIS — F4010 Social phobia, unspecified: Secondary | ICD-10-CM | POA: Diagnosis not present

## 2020-03-14 DIAGNOSIS — Z20822 Contact with and (suspected) exposure to covid-19: Secondary | ICD-10-CM | POA: Insufficient documentation

## 2020-03-14 DIAGNOSIS — K30 Functional dyspepsia: Secondary | ICD-10-CM | POA: Diagnosis not present

## 2020-03-14 DIAGNOSIS — F1590 Other stimulant use, unspecified, uncomplicated: Secondary | ICD-10-CM | POA: Insufficient documentation

## 2020-03-14 DIAGNOSIS — T50902A Poisoning by unspecified drugs, medicaments and biological substances, intentional self-harm, initial encounter: Secondary | ICD-10-CM | POA: Insufficient documentation

## 2020-03-14 DIAGNOSIS — O99611 Diseases of the digestive system complicating pregnancy, first trimester: Secondary | ICD-10-CM | POA: Diagnosis not present

## 2020-03-14 DIAGNOSIS — O99351 Diseases of the nervous system complicating pregnancy, first trimester: Secondary | ICD-10-CM | POA: Diagnosis not present

## 2020-03-14 DIAGNOSIS — F329 Major depressive disorder, single episode, unspecified: Secondary | ICD-10-CM | POA: Insufficient documentation

## 2020-03-14 DIAGNOSIS — G4700 Insomnia, unspecified: Secondary | ICD-10-CM | POA: Diagnosis not present

## 2020-03-14 DIAGNOSIS — Z818 Family history of other mental and behavioral disorders: Secondary | ICD-10-CM | POA: Diagnosis not present

## 2020-03-14 LAB — RAPID URINE DRUG SCREEN, HOSP PERFORMED
Amphetamines: NOT DETECTED
Barbiturates: NOT DETECTED
Benzodiazepines: NOT DETECTED
Cocaine: NOT DETECTED
Opiates: NOT DETECTED
Tetrahydrocannabinol: POSITIVE — AB

## 2020-03-14 LAB — PREGNANCY, URINE: Preg Test, Ur: POSITIVE — AB

## 2020-03-14 NOTE — ED Provider Notes (Signed)
[facility]-EMERGENCY DEPT Provider Note   CSN: [id number] Arrival date & time: 03/14/20  2207     History Chief Complaint  Patient presents with  . Suicidal    Laura Lambert is a 24 y.o. female with a history of generalized anxiety disorder, borderline personality disorder, and depression who presents to the ED with suicidal ideation over the past few days, worse today, leading to overdose attempt @ 16:30. Patient states she took 5 tablets of 50 mg Trazodone @ 16:30 today to harm herself. No co-ingestion, has not had her other daily prescribed medications today. No alleviating/aggravating factors. Denies HI or hallucinations. Denies N/V/D, abdominal pain, chest pain, dyspnea, syncope, or confusion. Denies drug or alcohol use.  HPI     History reviewed. No pertinent past medical history.  Patient Active Problem List   Diagnosis Date Noted  . Borderline personality disorder (HCC) 11/12/2019  . MDD (major depressive disorder), recurrent severe, without psychosis (HCC) 11/12/2019  . Social phobia 04/09/2018  . Generalized anxiety disorder 04/09/2018  . Insomnia 04/09/2018    Past Surgical History:  Procedure Laterality Date  . DENTAL SURGERY       OB History   No obstetric history on file.     Family History  Problem Relation Age of Onset  . Cancer Other   . Hyperlipidemia Other   . Hypertension Other   . Diabetes Other   . Heart attack Other   . Hypertension Father   . Depression Maternal Grandmother     Social History   Tobacco Use  . Smoking status: Never Smoker  . Smokeless tobacco: Never Used  Substance Use Topics  . Alcohol use: Yes    Comment: Occ  . Drug use: Yes    Types: Marijuana    Home Medications Prior to Admission medications   Medication Sig Start Date End Date Taking? Authorizing Provider  ARIPiprazole (ABILIFY) 20 MG tablet Take 20 mg by mouth every evening.  10/23/19   [provider]  LESSINA-28  0.1-20 MG-MCG tablet Take 1 tablet by mouth daily. 10/29/19   [provider]  propranolol (INDERAL) 40 MG tablet Take 40 mg by mouth 2 (two) times daily as needed (anxiety).    [provider]  sertraline (ZOLOFT) 50 MG tablet Take 125 mg by mouth at bedtime.  10/23/19   [provider]  traZODone (DESYREL) 50 MG tablet Take 1-2 tabs po QHS prn insomnia Patient taking differently: Take 50 mg by mouth at bedtime as needed for sleep.  05/21/18   Corie Chiquito, PMHNP    Allergies    Patient has no known allergies.  Review of Systems   Review of Systems  Constitutional: Negative for chills and fever.  Respiratory: Negative for shortness of breath.   Cardiovascular: Negative for chest pain.  Gastrointestinal: Negative for abdominal pain, diarrhea, nausea and vomiting.  Neurological: Negative for seizures, syncope and weakness.  Psychiatric/Behavioral: Positive for suicidal ideas. Negative for confusion.  All other systems reviewed and are negative.   Physical Exam Updated Vital Signs BP 113/63 (BP Location: Left Arm)   Pulse 69   Temp 98 F (36.7 C) (Oral)   Resp 14   Ht 5\' 6"  (1.676 m)   Wt 72.6 kg   LMP 02/12/2020   SpO2 96%   BMI 25.82 kg/m   Physical Exam Vitals and nursing note reviewed.  Constitutional:      General: She is not in acute distress.    Appearance: She  is well-developed. She is not toxic-appearing.  HENT:     Head: Normocephalic and atraumatic.  Eyes:     General:        Right eye: No discharge.        Left eye: No discharge.     Conjunctiva/sclera: Conjunctivae normal.  Cardiovascular:     Rate and Rhythm: Normal rate and regular rhythm.  Pulmonary:     Effort: Pulmonary effort is normal. No respiratory distress.     Breath sounds: Normal breath sounds. No wheezing, rhonchi or rales.  Abdominal:     General: There is no distension.     Palpations: Abdomen is soft.     Tenderness: There is no abdominal tenderness.    Musculoskeletal:     Cervical back: Neck supple.  Skin:    General: Skin is warm and dry.     Findings: No rash.  Neurological:     Mental Status: She is alert.     Comments: Clear speech.   Psychiatric:        Attention and Perception: Attention normal.        Thought Content: Thought content includes suicidal ideation. Thought content does not include homicidal ideation. Thought content includes suicidal plan. Thought content does not include homicidal plan.     ED Results / Procedures / Treatments   Labs (all labs ordered are listed, but only abnormal results are displayed) Labs Reviewed  PREGNANCY, URINE - Abnormal; Notable for the following components:      Result Value   Preg Test, Ur POSITIVE (*)    All other components within normal limits  COMPREHENSIVE METABOLIC PANEL  ETHANOL  SALICYLATE LEVEL  ACETAMINOPHEN LEVEL  CBC  RAPID URINE DRUG SCREEN, HOSP PERFORMED    EKG None  Radiology No results found.  Procedures Procedures (including critical care time)  Medications Ordered in ED Medications - No data to display  ED Course  I have reviewed the triage vital signs and the nursing notes.  Pertinent labs & imaging results that were available during my care of the patient were reviewed by me and considered in my medical decision making (see chart for details).    MDM Rules/Calculators/A&P                          Patient presents to the ED with complaints of SI with overdose.  Took 250 mg of Trazodone @ 16:30 today.  Currently feeling at baseline.  Vitals are within normal limits.  Additional history obtained:  Additional history obtained from nursing note & chart review.   23:23: CONSULT: Discussed w/ Danielle with poison control-typically when recommend a 6-hour observation status post ingestion, patient has passed this at this time, recommends 4-hour Tylenol level, CMP, and EKG.  If QTC is less than 500 and patient remaining at baseline can  medically clear.  EKG: QTC less than 500 Lab Tests:  I Ordered, reviewed, and interpreted labs, which included:  CBC, CMP, acetaminophen level, salicylate level, Ethanol level, UDS, pregnancy test.   Patient's pregnancy test is positive, she was aware of this prior to testing today, she denies any pelvic pain, vaginal bleeding, vaginal discharge, or dysuria.  Covid test is negative.  Patient is medically cleared - disposition per [facility].  Patient has been evaluated by TTS, recommendation for inpatient, pending placement.  Portions of this note were generated with Scientist, clinical (histocompatibility and immunogenetics). Dictation errors may occur despite best attempts at proofreading.  Final Clinical  Impression(s) / ED Diagnoses Final diagnoses:  Suicidal ideation  Intentional drug overdose, initial encounter [facility])    Rx / DC Orders ED Discharge Orders    None       Cherly Anderson, PA-C 03/15/20 0235    Melene Plan, DO 03/15/20 [phone]

## 2020-03-14 NOTE — ED Notes (Signed)
Pt is dressed out and wanded by security. Pt belongings have been taken and labeled.  °

## 2020-03-14 NOTE — ED Triage Notes (Signed)
Pt reports having suicidal ideation with plan of overdosing on prescribed medication.

## 2020-03-14 NOTE — H&P (Signed)
Behavioral Health Medical Screening Exam  Laura Lambert is an 24 y.o. female who presented to [facility] due to worsening depression and suicidal thoughts with a plan to overdose. Patient reports that she overdosed on propanolol and trazodone about a week ago but did not seek medical assistance. Patient reports that she is approximately [redacted] weeks pregnant.  [facility] reports that there are no appropriate beds available. Patient refuses to transfer to [facility].   Total Time spent with patient: 20 minutes  Psychiatric Specialty Exam: Physical Exam Constitutional:      General: She is not in acute distress.    Appearance: She is not ill-appearing or toxic-appearing.  Neurological:     Mental Status: She is alert.  Psychiatric:        Thought Content: Thought content includes suicidal ideation. Thought content includes suicidal plan.    Review of Systems  Constitutional: Negative for chills, diaphoresis, fatigue and fever.  Respiratory: Negative for shortness of breath.   Cardiovascular: Negative for chest pain and palpitations.  Gastrointestinal: Negative for diarrhea, nausea and vomiting.  Neurological: Negative for seizures.  Psychiatric/Behavioral: Positive for dysphoric mood, sleep disturbance and suicidal ideas. The patient is nervous/anxious.    There were no vitals taken for this visit.There is no height or weight on file to calculate BMI. General Appearance: Casual and Neat Eye Contact:  Good Speech:  Clear and Coherent and Normal Rate Volume:  Normal Mood:  Anxious, Depressed, Hopeless and Worthless Affect:  Congruent, Depressed and Tearful Thought Process:  Coherent, Goal Directed, Linear and Descriptions of Associations: Intact Orientation:  Full (Time, Place, and Person) Thought Content:  Logical Suicidal Thoughts:  Yes.  with intent/plan Homicidal Thoughts:  No Memory:  Immediate;   Good Recent;   Good Remote;   Good Judgement:  Intact Insight:  Lacking Psychomotor  Activity:  Normal Concentration: Concentration: Fair and Attention Span: Fair Recall:  Good Fund of Knowledge:Good Language: Good Akathisia:  Negative Handed:  Right AIMS (if indicated):    Assets:  Communication Skills Desire for Improvement Leisure Time Physical Health Sleep:      Recommendations: Based on my evaluation the patient does not appear to have an emergency medical condition.  Jackelyn Poling, NP 03/14/2020, 11:51 PM
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
## 2020-03-15 ENCOUNTER — Other Ambulatory Visit: Payer: Self-pay

## 2020-03-15 ENCOUNTER — Encounter (HOSPITAL_COMMUNITY): Payer: Self-pay | Admitting: Student

## 2020-03-15 ENCOUNTER — Inpatient Hospital Stay (HOSPITAL_COMMUNITY)
Admission: AD | Admit: 2020-03-15 | Discharge: 2020-03-18 | DRG: 832 | Disposition: A | Discharge status: Home / Self Care | Payer: Federal, State, Local not specified - PPO | Source: Intra-hospital | Attending: Psychiatry | Admitting: Psychiatry

## 2020-03-15 ENCOUNTER — Encounter (HOSPITAL_COMMUNITY): Payer: Self-pay | Admitting: Psychiatry

## 2020-03-15 DIAGNOSIS — O99351 Diseases of the nervous system complicating pregnancy, first trimester: Secondary | ICD-10-CM | POA: Diagnosis not present

## 2020-03-15 DIAGNOSIS — F4010 Social phobia, unspecified: Secondary | ICD-10-CM

## 2020-03-15 DIAGNOSIS — R45851 Suicidal ideations: Secondary | ICD-10-CM | POA: Insufficient documentation

## 2020-03-15 DIAGNOSIS — F332 Major depressive disorder, recurrent severe without psychotic features: Secondary | ICD-10-CM | POA: Diagnosis not present

## 2020-03-15 DIAGNOSIS — Z915 Personal history of self-harm: Secondary | ICD-10-CM

## 2020-03-15 DIAGNOSIS — O99341 Other mental disorders complicating pregnancy, first trimester: Secondary | ICD-10-CM | POA: Diagnosis not present

## 2020-03-15 DIAGNOSIS — Z331 Pregnant state, incidental: Secondary | ICD-10-CM | POA: Diagnosis not present

## 2020-03-15 DIAGNOSIS — G4700 Insomnia, unspecified: Secondary | ICD-10-CM | POA: Diagnosis not present

## 2020-03-15 DIAGNOSIS — Z3490 Encounter for supervision of normal pregnancy, unspecified, unspecified trimester: Secondary | ICD-10-CM

## 2020-03-15 DIAGNOSIS — Z3A01 Less than 8 weeks gestation of pregnancy: Secondary | ICD-10-CM

## 2020-03-15 DIAGNOSIS — O99611 Diseases of the digestive system complicating pregnancy, first trimester: Secondary | ICD-10-CM | POA: Diagnosis present

## 2020-03-15 DIAGNOSIS — Z79899 Other long term (current) drug therapy: Secondary | ICD-10-CM | POA: Diagnosis not present

## 2020-03-15 DIAGNOSIS — Z20822 Contact with and (suspected) exposure to covid-19: Secondary | ICD-10-CM | POA: Diagnosis not present

## 2020-03-15 DIAGNOSIS — F5105 Insomnia due to other mental disorder: Secondary | ICD-10-CM

## 2020-03-15 DIAGNOSIS — F411 Generalized anxiety disorder: Secondary | ICD-10-CM

## 2020-03-15 DIAGNOSIS — F603 Borderline personality disorder: Secondary | ICD-10-CM

## 2020-03-15 DIAGNOSIS — Z3689 Encounter for other specified antenatal screening: Secondary | ICD-10-CM | POA: Diagnosis not present

## 2020-03-15 DIAGNOSIS — K30 Functional dyspepsia: Secondary | ICD-10-CM | POA: Diagnosis not present

## 2020-03-15 DIAGNOSIS — Z3A00 Weeks of gestation of pregnancy not specified: Secondary | ICD-10-CM | POA: Diagnosis not present

## 2020-03-15 DIAGNOSIS — Z818 Family history of other mental and behavioral disorders: Secondary | ICD-10-CM | POA: Diagnosis not present

## 2020-03-15 DIAGNOSIS — F99 Mental disorder, not otherwise specified: Secondary | ICD-10-CM

## 2020-03-15 HISTORY — DX: Anxiety disorder, unspecified: F41.9

## 2020-03-15 HISTORY — DX: Depression, unspecified: F32.A

## 2020-03-15 LAB — COMPREHENSIVE METABOLIC PANEL WITH GFR
ALT: 45 U/L — ABNORMAL HIGH (ref 0–44)
AST: 25 U/L (ref 15–41)
Albumin: 4 g/dL (ref 3.5–5.0)
Alkaline Phosphatase: 79 U/L (ref 38–126)
Anion gap: 14 (ref 5–15)
BUN: 10 mg/dL (ref 6–20)
CO2: 24 mmol/L (ref 22–32)
Calcium: 9.4 mg/dL (ref 8.9–10.3)
Chloride: 99 mmol/L (ref 98–111)
Creatinine, Ser: 0.59 mg/dL (ref 0.44–1.00)
GFR calc Af Amer: >60 mL/min
GFR calc non Af Amer: >60 mL/min
Glucose, Bld: 111 mg/dL — ABNORMAL HIGH (ref 70–99)
Potassium: 3.9 mmol/L (ref 3.5–5.1)
Sodium: 137 mmol/L (ref 135–145)
Total Bilirubin: 0.8 mg/dL (ref 0.3–1.2)
Total Protein: 7.6 g/dL (ref 6.5–8.1)

## 2020-03-15 LAB — ETHANOL: Alcohol, Ethyl (B): <10 mg/dL (ref <10)

## 2020-03-15 LAB — CBC
HCT: 39.9 % (ref 36.0–46.0)
Hemoglobin: 13.5 g/dL (ref 12.0–15.0)
MCH: 30.9 pg (ref 26.0–34.0)
MCHC: 33.8 g/dL (ref 30.0–36.0)
MCV: 91.3 fL (ref 80.0–100.0)
Platelets: 270 10*3/uL (ref 150–400)
RBC: 4.37 MIL/uL (ref 3.87–5.11)
RDW: 11.9 % (ref 11.5–15.5)
WBC: 11 10*3/uL — ABNORMAL HIGH (ref 4.0–10.5)
nRBC: 0 % (ref 0.0–0.2)

## 2020-03-15 LAB — SALICYLATE LEVEL: Salicylate Lvl: <7 mg/dL — ABNORMAL LOW (ref 7.0–30.0)

## 2020-03-15 LAB — ACETAMINOPHEN LEVEL: Acetaminophen (Tylenol), Serum: <10 ug/mL — ABNORMAL LOW (ref 10–30)

## 2020-03-15 LAB — SARS CORONAVIRUS 2 BY RT PCR (HOSPITAL ORDER, PERFORMED IN ~~LOC~~ HOSPITAL LAB): SARS Coronavirus 2: NEGATIVE

## 2020-03-15 MED ORDER — MAGNESIUM HYDROXIDE 400 MG/5ML PO SUSP: 30.0000 mL | Freq: Every day | ORAL | Status: DC | PRN

## 2020-03-15 MED ORDER — PRENATAL MULTIVITAMIN CH
1.0000 | ORAL_TABLET | Freq: Every day | ORAL | Status: DC
  Administered 2020-03-16 – 2020-03-17 (×2): 1 via ORAL
  Filled 2020-03-15 (×3): qty 1

## 2020-03-15 MED ORDER — SERTRALINE HCL 25 MG PO TABS
125.0000 mg | ORAL_TABLET | Freq: Every day | ORAL | Status: DC
  Administered 2020-03-15 – 2020-03-18 (×4): 125 mg via ORAL
  Filled 2020-03-15 (×6): qty 1

## 2020-03-15 MED ORDER — ACETAMINOPHEN 325 MG PO TABS: 650.0000 mg | ORAL_TABLET | Freq: Four times a day (QID) | ORAL | Status: DC | PRN

## 2020-03-15 MED ORDER — ALUM & MAG HYDROXIDE-SIMETH 200-200-20 MG/5ML PO SUSP: 30.0000 mL | ORAL | Status: DC | PRN

## 2020-03-15 MED ORDER — ACETAMINOPHEN 325 MG PO TABS: 650.0000 mg | ORAL_TABLET | ORAL | Status: DC | PRN

## 2020-03-15 MED ORDER — INFLUENZA VAC SPLIT QUAD 0.5 ML IM SUSY
0.5000 mL | PREFILLED_SYRINGE | INTRAMUSCULAR | Status: DC
  Filled 2020-03-15: qty 0.5

## 2020-03-15 MED ORDER — DIPHENHYDRAMINE HCL 25 MG PO CAPS: 25.0000 mg | ORAL_CAPSULE | Freq: Four times a day (QID) | ORAL | Status: DC | PRN

## 2020-03-15 MED ORDER — DIPHENHYDRAMINE HCL 25 MG PO CAPS
50.0000 mg | ORAL_CAPSULE | Freq: Every evening | ORAL | Status: DC | PRN
  Administered 2020-03-16 – 2020-03-17 (×2): 50 mg via ORAL
  Filled 2020-03-15 (×3): qty 2

## 2020-03-15 NOTE — Tx Team (Signed)
Initial Treatment Plan 03/15/2020 7:48 PM [name] [id number]    PATIENT STRESSORS: Financial difficulties Marital or family conflict Occupational concerns   PATIENT STRENGTHS: Ability for insight Active sense of humor Average or above average intelligence Capable of independent living Metallurgist fund of knowledge Motivation for treatment/growth Physical Health Supportive family/friends Work skills   PATIENT IDENTIFIED PROBLEMS: "anxiety"   Depression"  "suicidal thoughts"                 DISCHARGE CRITERIA:  Ability to meet basic life and health needs Adequate post-discharge living arrangements Improved stabilization in mood, thinking, and/or behavior Medical problems require only outpatient monitoring Motivation to continue treatment in a less acute level of care Need for constant or close observation no longer present Reduction of life-threatening or endangering symptoms to within safe limits Safe-care adequate arrangements made Verbal commitment to aftercare and medication compliance  PRELIMINARY DISCHARGE PLAN: Attend aftercare/continuing care group Attend PHP/IOP Outpatient therapy Placement in alternative living arrangements Return to previous work or school arrangements  PATIENT/FAMILY INVOLVEMENT: This treatment plan has been presented to and reviewed with the patient, Laura Lambert.  The patient and family have been given the opportunity to ask questions and make suggestions.  Quintella Reichert [place], [place] 03/15/2020, 7:48 PM

## 2020-03-15 NOTE — Progress Notes (Addendum)
   03/15/20 2051  Psych Admission Type (Psych Patients Only)  Admission Status Voluntary  Psychosocial Assessment  Patient Complaints Sadness;Crying spells;Anxiety;Depression  Eye Contact Brief  Facial Expression Sad;Anxious  Affect Apprehensive;Sad  Speech Logical/coherent  Interaction Other (Comment) (appropriate)  Motor Activity Other (Comment) (wnl)  Appearance/Hygiene Unremarkable  Behavior Characteristics Cooperative;Anxious  Mood Depressed;Anxious;Sad  Thought Process  Coherency WDL  Content WDL  Delusions None reported or observed  Perception WDL  Hallucination None reported or observed  Judgment Poor  Confusion None  Danger to Self  Current suicidal ideation? Passive  Self-Injurious Behavior Some self-injurious ideation observed or expressed.  No lethal plan expressed   Agreement Not to Harm Self Yes  Description of Agreement verbally agrees to approach staff  Danger to Others  Danger to Others None reported or observed   Pt seen at nurse's station. Pt tearful and sad. States that she has a lot going on in her life right now. Emotional support given to pt. Pt encouraged to use her time here at [facility] to work on herself and gather resources and coping skills to help her deal with her issues outside these walls. Pt rates anxiety 3/10 and depression 8/10. Pt endorses passive SI but contracts for safety. Pt is also pregnant, she believes about [redacted] weeks along.

## 2020-03-15 NOTE — Plan of Care (Signed)
Nurse discussed anxiety, depression, coping skills with patient. 

## 2020-03-15 NOTE — Consult Note (Signed)
Telepsych Consultation   Reason for Consult:  Suicidal ideation Referring Physician:  Melene Plan, DO Location of Patient: [facility] ED Location of Provider: Other: [facility]  Patient Identification: Laura Lambert MRN:  [id number] Principal Diagnosis: MDD (major depressive disorder), recurrent severe, without psychosis (HCC) Diagnosis:  Principal Problem:   MDD (major depressive disorder), recurrent severe, without psychosis (HCC) Active Problems:   Generalized anxiety disorder   Borderline personality disorder (HCC)   Total Time spent with patient: 30 minutes  Subjective:   Laura Lambert is a 24 y.o. female patient presented with complaints of worsening depression and suicidal thoughts.  HPI:  Laura Lambert, 24 y.o., female patient seen via tele psych by this provider, Dr. Lucianne Muss; and chart reviewed on 03/15/20.  On evaluation Laura Lambert reports she came to the hospital because she was having suicidal thoughts.  "I found out I was pregnant and the guy doesn't want the baby and doesn't want to stay with me.  I just want to kill myself."  Patient states she isn't sure if she wants to keep or abort the baby; but "My parents are not supportive of an abortion."  States that she lives with her parents who are generally supportive; patient is unemployed and is supported by her parents.  Patient is unable to contract for safety.  States that her last suicide attempt was one week ago prior to finding out she was pregnant.   During evaluation Laura Lambert is alert/oriented x 4; calm/cooperative; and mood is congruent with affect.  She does not appear to be responding to internal/external stimuli or delusional thoughts.  Patient denies homicidal ideation, psychosis, and paranoia.  Patient answered question appropriately.     Past Psychiatric History: See above  Risk to Self:  Yes Risk to Others:  No Prior Inpatient Therapy:  Yes Prior Outpatient Therapy:   Yes  Past Medical History: History reviewed. No pertinent past medical history.  Past Surgical History:  Procedure Laterality Date  . DENTAL SURGERY     Family History:  Family History  Problem Relation Age of Onset  . Cancer Other   . Hyperlipidemia Other   . Hypertension Other   . Diabetes Other   . Heart attack Other   . Hypertension Father   . Depression Maternal Grandmother    Family Psychiatric  History: See above Social History:  Social History   Substance and Sexual Activity  Alcohol Use Yes   Comment: Occ     Social History   Substance and Sexual Activity  Drug Use Yes  . Types: Marijuana    Social History   Socioeconomic History  . Marital status: Single    Spouse name: Not on file  . Number of children: Not on file  . Years of education: Not on file  . Highest education level: Not on file  Occupational History  . Not on file  Tobacco Use  . Smoking status: Never Smoker  . Smokeless tobacco: Never Used  Substance and Sexual Activity  . Alcohol use: Yes    Comment: Occ  . Drug use: Yes    Types: Marijuana  . Sexual activity: Not on file  Other Topics Concern  . Not on file  Social History Narrative  . Not on file   Social Determinants of Health   Financial Resource Strain:   . Difficulty of Paying Living Expenses: Not on file  Food Insecurity:   . Worried About Programme researcher, broadcasting/film/video in the  Last Year: Not on file  . Ran Out of Food in the Last Year: Not on file  Transportation Needs:   . Lack of Transportation (Medical): Not on file  . Lack of Transportation (Non-Medical): Not on file  Physical Activity:   . Days of Exercise per Week: Not on file  . Minutes of Exercise per Session: Not on file  Stress:   . Feeling of Stress : Not on file  Social Connections:   . Frequency of Communication with Friends and Family: Not on file  . Frequency of Social Gatherings with Friends and Family: Not on file  . Attends Religious Services: Not on file   . Active Member of Clubs or Organizations: Not on file  . Attends Banker Meetings: Not on file  . Marital Status: Not on file   Additional Social History:    Allergies:  No Known Allergies  Labs:  Results for orders placed or performed during the hospital encounter of 03/14/20 (from the past 48 hour(s))  Rapid urine drug screen (hospital performed)     Status: Abnormal   Collection Time: 03/14/20 10:44 PM  Result Value Ref Range   Opiates NONE DETECTED NONE DETECTED   Cocaine NONE DETECTED NONE DETECTED   Benzodiazepines NONE DETECTED NONE DETECTED   Amphetamines NONE DETECTED NONE DETECTED   Tetrahydrocannabinol POSITIVE (A) NONE DETECTED   Barbiturates NONE DETECTED NONE DETECTED    Comment: (NOTE) DRUG SCREEN FOR MEDICAL PURPOSES ONLY.  IF CONFIRMATION IS NEEDED FOR ANY PURPOSE, NOTIFY LAB WITHIN 5 DAYS.  LOWEST DETECTABLE LIMITS FOR URINE DRUG SCREEN Drug Class                     Cutoff (ng/mL) Amphetamine and metabolites    1000 Barbiturate and metabolites    200 Benzodiazepine                 200 Tricyclics and metabolites     300 Opiates and metabolites        300 Cocaine and metabolites        300 THC                            50 Performed at [facility], [address], [place], [place] [address]   Pregnancy, urine     Status: Abnormal   Collection Time: 03/14/20 10:44 PM  Result Value Ref Range   Preg Test, Ur POSITIVE (A) NEGATIVE    Comment:        THE SENSITIVITY OF THIS METHODOLOGY IS >20 mIU/mL. Performed at [facility] [address] [place], [place] [address]   SARS Coronavirus 2 by RT PCR (hospital order, performed in [facility] hospital lab) Nasopharyngeal Nasopharyngeal Swab     Status: None   Collection Time: 03/15/20 12:36 AM   Specimen: Nasopharyngeal Swab  Result Value Ref Range   SARS Coronavirus 2 NEGATIVE NEGATIVE    Comment: (NOTE) SARS-CoV-2 target nucleic acids are NOT  DETECTED.  The SARS-CoV-2 RNA is generally detectable in upper and lower respiratory specimens during the acute phase of infection. The lowest concentration of SARS-CoV-2 viral copies this assay can detect is 250 copies / mL. A negative result does not preclude SARS-CoV-2 infection and should not be used as the sole basis for treatment or other patient management decisions.  A negative result may occur with improper specimen collection / handling, submission of specimen  other than nasopharyngeal swab, presence of viral mutation(s) within the areas targeted by this assay, and inadequate number of viral copies (<250 copies / mL). A negative result must be combined with clinical observations, patient history, and epidemiological information.  Fact Sheet for Patients:   BoilerBrush.com.cy  Fact Sheet for Healthcare Providers: https://pope.com/  This test is not yet approved or  cleared by the [place] FDA and has been authorized for detection and/or diagnosis of SARS-CoV-2 by FDA under an Emergency Use Authorization (EUA).  This EUA will remain in effect (meaning this test can be used) for the duration of the COVID-19 declaration under Section 564(b)(1) of the Act, 21 U.S.C. section 360bbb-3(b)(1), unless the authorization is terminated or revoked sooner.  Performed at [facility], [address], [place], [place] [address]   Comprehensive metabolic panel     Status: Abnormal   Collection Time: 03/15/20 12:48 AM  Result Value Ref Range   Sodium 137 135 - 145 mmol/L   Potassium 3.9 3.5 - 5.1 mmol/L   Chloride 99 98 - 111 mmol/L   CO2 24 22 - 32 mmol/L   Glucose, Bld 111 (H) 70 - 99 mg/dL    Comment: Glucose reference range applies only to samples taken after fasting for at least 8 hours.   BUN 10 6 - 20 mg/dL   Creatinine, Ser 6.44 0.44 - 1.00 mg/dL   Calcium 9.4 8.9 - 03.4 mg/dL   Total Protein 7.6 6.5 -  8.1 g/dL   Albumin 4.0 3.5 - 5.0 g/dL   AST 25 15 - 41 U/L   ALT 45 (H) 0 - 44 U/L   Alkaline Phosphatase 79 38 - 126 U/L   Total Bilirubin 0.8 0.3 - 1.2 mg/dL   GFR calc non Af Amer >60 >60 mL/min   GFR calc Af Amer >60 >60 mL/min   Anion gap 14 5 - 15    Comment: Performed at [facility], [address], [place], [place] [address]  Ethanol     Status: None   Collection Time: 03/15/20 12:48 AM  Result Value Ref Range   Alcohol, Ethyl (B) <10 <10 mg/dL    Comment: (NOTE) Lowest detectable limit for serum alcohol is 10 mg/dL.  For medical purposes only. Performed at [facility], [address], [place], [place] [address]   Salicylate level     Status: Abnormal   Collection Time: 03/15/20 12:48 AM  Result Value Ref Range   Salicylate Lvl <7.0 (L) 7.0 - 30.0 mg/dL    Comment: Performed at [facility], [address] [place], [place] [address]  Acetaminophen level     Status: Abnormal   Collection Time: 03/15/20 12:48 AM  Result Value Ref Range   Acetaminophen (Tylenol), Serum <10 (L) 10 - 30 ug/mL    Comment: (NOTE) Therapeutic concentrations vary significantly. A range of 10-30 ug/mL  may be an effective concentration for many patients. However, some  are best treated at concentrations outside of this range. Acetaminophen concentrations >150 ug/mL at 4 hours after ingestion  and >50 ug/mL at 12 hours after ingestion are often associated with  toxic reactions.  Performed at [facility], [address], [place], [place] [address]   cbc     Status: Abnormal   Collection Time: 03/15/20 12:48 AM  Result Value Ref Range   WBC 11.0 (H) 4.0 - 10.5 K/uL   RBC 4.37 3.87 - 5.11 MIL/uL  Hemoglobin 13.5 12.0 - 15.0 g/dL   HCT 16.139.9 36 - 46 %   MCV 91.3 80.0 - 100.0 fL   MCH 30.9 26.0 - 34.0 pg   MCHC 33.8 30.0 - 36.0 g/dL   RDW 09.611.9 04.511.5 - 40.915.5 %   Platelets 270 150 - 400 K/uL   nRBC 0.0 0.0 - 0.2 %     Comment: Performed at [facility], [address] [place], [place] [address]    Medications:  Current Facility-Administered Medications  Medication Dose Route Frequency Provider Last Rate Last Admin  . acetaminophen (TYLENOL) tablet 650 mg  650 mg Oral Q4H PRN Petrucelli, Samantha R, PA-C       Current Outpatient Medications  Medication Sig Dispense Refill  . ARIPiprazole (ABILIFY) 20 MG tablet Take 20 mg by mouth every evening.     . LESSINA-28 0.1-20 MG-MCG tablet Take 1 tablet by mouth daily.    . propranolol (INDERAL) 40 MG tablet Take 40 mg by mouth 2 (two) times daily as needed (anxiety).    . sertraline (ZOLOFT) 50 MG tablet Take 125 mg by mouth at bedtime.     . traZODone (DESYREL) 50 MG tablet Take 1-2 tabs po QHS prn insomnia (Patient taking differently: Take 50 mg by mouth at bedtime as needed for sleep. ) 180 tablet 0    Musculoskeletal: Strength & Muscle Tone: within normal limits Gait & Station: normal Patient leans: N/A  Psychiatric Specialty Exam: Physical Exam Vitals and nursing note reviewed. Exam conducted with a chaperone present.  Constitutional:      Appearance: Normal appearance.  Pulmonary:     Effort: Pulmonary effort is normal.  Musculoskeletal:        General: Normal range of motion.     Cervical back: Normal range of motion.  Neurological:     Mental Status: She is alert.  Psychiatric:        Attention and Perception: Attention and perception normal.        Mood and Affect: Mood is anxious and depressed.        Speech: Speech normal.        Behavior: Behavior normal. Behavior is cooperative.        Thought Content: Thought content is not paranoid or delusional. Thought content includes suicidal ideation. Thought content does not include homicidal ideation.        Cognition and Memory: Cognition and memory normal.        Judgment: Judgment is impulsive.     Review of Systems  Psychiatric/Behavioral: Positive for suicidal  ideas. Negative for hallucinations. The patient is nervous/anxious.        Patient states that she is pregnant and the father of baby doesn't want baby and broke up with her. States having thoughts of killing herself; with history of 4 prior attempts; unable to contract for safety  All other systems reviewed and are negative.   Blood pressure 110/68, pulse 98, temperature 98.5 F (36.9 C), temperature source Oral, resp. rate 18, height 5\' 6"  (1.676 m), weight 72.6 kg, last menstrual period 02/12/2020, SpO2 95 %.Body mass index is 25.82 kg/m.  General Appearance: Casual  Eye Contact:  Good  Speech:  Clear and Coherent and Normal Rate  Volume:  Normal  Mood:  Anxious and Depressed  Affect:  Depressed and Tearful  Thought Process:  Coherent, Goal Directed, Linear and Descriptions of Associations: Intact  Orientation:  Full (Time, Place, and Person)  Thought Content:  WDL  Suicidal  Thoughts:  Yes.  with intent/plan  Homicidal Thoughts:  No  Memory:  Immediate;   Good Recent;   Good  Judgement:  Impaired  Insight:  Fair and Lacking  Psychomotor Activity:  Normal  Concentration:  Concentration: Good and Attention Span: Good  Recall:  Good  Fund of Knowledge:  Fair  Language:  Good  Akathisia:  No  Handed:  Right  AIMS (if indicated):     Assets:  Communication Skills Desire for Improvement Housing Social Support  ADL's:  Intact  Cognition:  WNL  Sleep:        Treatment Plan Summary: Plan Inpatient psychiatric treatment  Disposition: Recommend psychiatric Inpatient admission when medically cleared.  This service was provided via telemedicine using a 2-way, interactive audio and video technology.  Names of all persons participating in this telemedicine service and their role in this encounter. Name: Assunta Found Role: NP  Name: Dr. Nelly Rout Role: Psychiatrist  Name: Jenetta Loges Role: P  Name:  Role:     Assunta Found, NP 03/15/2020 11:56 AM

## 2020-03-15 NOTE — Progress Notes (Signed)
Patient is 24 yrs old, first hospitalization at [facility], voluntary.  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  Stated she is [redacted] weeks pregnant but has not seen MD.  Parents are supportive and patient gave her approval for staff to talk to parents.  Her boyfriend became upset when he found out she was pregnant.  Patient thought about overdosing on pills.  Boyfriend grabbed the pills out of her hands and brought her to hospital.  Boyfriend broke up with her because of other things.  Never married, no children.  Patient denied all drugs, no tobacco, no THC, no alcohol.  SI but no plans to hurt herself, contracts for safety.  Madelin Rear is father of her child.  Denied HI.  Denied A/V hallucinations.  Rated anxiety 4, depression and hopeless 8.  Tattoos on L arm, L thigh, L ankle.  Dr. Rodrigo Ran, [facility] [place], last appointment two yrs ago.  No hearing, dental or vision problems. Fall risk information given and discussed with patient who stated she understood and had no questions, low fall risk. Patient oriented to 300 hall units, given food/drink.
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
## 2020-03-15 NOTE — BH Assessment (Signed)
[facility] Assessment Progress Note  Per Shuvon Rankin, NP, this voluntary pt requires psychiatric hospitalization at this time.  Gretta Arab, RN, [facility] has assigned pt to [facility] Rm 305-1; [facility] will be ready to receive pt at 13:00.  Pt has signed Voluntary Admission and Consent for Treatment, as well as Consent to Release Information to Dr Joellyn Quails and to Particia Jasper, her outpatient providers, as well as her parents, and notification calls have been attempted to the providers.  Signed forms have been faxed to [facility].  EDP Kennis Carina, MD and pt's nurse have been notified, and the nurse agrees to send original paperwork along with pt via [employer], and to call report to [phone].  Doylene Canning, [place] Behavioral Health Coordinator [phone]

## 2020-03-16 DIAGNOSIS — Z331 Pregnant state, incidental: Secondary | ICD-10-CM

## 2020-03-16 DIAGNOSIS — Z3490 Encounter for supervision of normal pregnancy, unspecified, unspecified trimester: Secondary | ICD-10-CM

## 2020-03-16 DIAGNOSIS — Z3A01 Less than 8 weeks gestation of pregnancy: Secondary | ICD-10-CM | POA: Insufficient documentation

## 2020-03-16 LAB — T4, FREE: Free T4: 1.01 ng/dL (ref 0.61–1.12)

## 2020-03-16 NOTE — Progress Notes (Signed)
[facility] MD Progress Note  03/16/2020 9:56 AM Laura Lambert  MRN:  [id number]   Subjective: Patient states she is still feeling overwhelmed. She states she has suicidal ideations and plan is to get discharged and then commit it. She states she feels tired and sad. She denies homicidal ideations, auditory or visual hallucinations. She states nothing has changed from yesterday and she is in same situation, indecisive about her ongoing pregnancy. She states she is hungry and planning to eat her breakfast. She states she slept good last night and attended a group therapy before going to sleep.  Objective: Patient is seen and examined. She is  alert, oriented * 3. She is polite and respectful on approach, tearful at times, looks dysphoric and concerned. She promises to contract for safety if requires. She does not seem like responding to the internal stimuli. Her blood pressure 102/63, pulse 93, temperature 98.4 F (36.9 C), temperature source Oral, resp. rate 18. She slept 6 hours last night.  Principal Problem: Suicidal ideation Diagnosis: Principal Problem:   Suicidal ideation Active Problems:   Social phobia   Generalized anxiety disorder   Insomnia   Borderline personality disorder (HCC)   MDD (major depressive disorder), recurrent severe, without psychosis (HCC)   Pregnant state, incidental  Total Time spent with patient: 25 minutes  Past Psychiatric History: See H & P  Past Medical History:  Past Medical History:  Diagnosis Date  . Anxiety   . Depression     Past Surgical History:  Procedure Laterality Date  . DENTAL SURGERY     Family History:  Family History  Problem Relation Age of Onset  . Cancer Other   . Hyperlipidemia Other   . Hypertension Other   . Diabetes Other   . Heart attack Other   . Hypertension Father   . Depression Maternal Grandmother    Family Psychiatric  History: See H & P Social History:  Social History   Substance and Sexual Activity   Alcohol Use Never   Comment: Occ     Social History   Substance and Sexual Activity  Drug Use Never    Social History   Socioeconomic History  . Marital status: Single    Spouse name: Not on file  . Number of children: Not on file  . Years of education: Not on file  . Highest education level: Not on file  Occupational History  . Not on file  Tobacco Use  . Smoking status: Never Smoker  . Smokeless tobacco: Never Used  Vaping Use  . Vaping Use: Never used  Substance and Sexual Activity  . Alcohol use: Never    Comment: Occ  . Drug use: Never  . Sexual activity: Yes    Birth control/protection: None  Other Topics Concern  . Not on file  Social History Narrative  . Not on file   Social Determinants of Health   Financial Resource Strain:   . Difficulty of Paying Living Expenses: Not on file  Food Insecurity:   . Worried About Programme researcher, broadcasting/film/video in the Last Year: Not on file  . Ran Out of Food in the Last Year: Not on file  Transportation Needs:   . Lack of Transportation (Medical): Not on file  . Lack of Transportation (Non-Medical): Not on file  Physical Activity:   . Days of Exercise per Week: Not on file  . Minutes of Exercise per Session: Not on file  Stress:   . Feeling of Stress :  Not on file  Social Connections:   . Frequency of Communication with Friends and Family: Not on file  . Frequency of Social Gatherings with Friends and Family: Not on file  . Attends Religious Services: Not on file  . Active Member of Clubs or Organizations: Not on file  . Attends BankerClub or Organization Meetings: Not on file  . Marital Status: Not on file   Additional Social History:    Pain Medications: see MAR Prescriptions: see MAR Over the Counter: see MAR History of alcohol / drug use?: No history of alcohol / drug abuse Longest period of sobriety (when/how long): none user Negative Consequences of Use: Financial, Personal relationships Withdrawal Symptoms: Other  (Comment) (denied withdrawals)                    Sleep: Good  Appetite:  Good  Current Medications: Current Facility-Administered Medications  Medication Dose Route Frequency Provider Last Rate Last Admin  . acetaminophen (TYLENOL) tablet 650 mg  650 mg Oral Q6H PRN Antonieta Pertlary, Greg Lawson, MD      . alum & mag hydroxide-simeth (MAALOX/MYLANTA) 200-200-20 MG/5ML suspension 30 mL  30 mL Oral Q4H PRN Antonieta Pertlary, Greg Lawson, MD      . diphenhydrAMINE (BENADRYL) capsule 25 mg  25 mg Oral Q6H PRN Antonieta Pertlary, Greg Lawson, MD      . diphenhydrAMINE (BENADRYL) capsule 50 mg  50 mg Oral QHS PRN Antonieta Pertlary, Greg Lawson, MD      . influenza vac split quadrivalent PF (FLUARIX) injection 0.5 mL  0.5 mL Intramuscular Tomorrow-1000 Antonieta Pertlary, Greg Lawson, MD      . magnesium hydroxide (MILK OF MAGNESIA) suspension 30 mL  30 mL Oral Daily PRN Antonieta Pertlary, Greg Lawson, MD      . prenatal multivitamin tablet 1 tablet  1 tablet Oral Q1200 Antonieta Pertlary, Greg Lawson, MD      . sertraline (ZOLOFT) tablet 125 mg  125 mg Oral Daily Hjalmar Ballengee, Geralynn RileAnjali, MD   125 mg at 03/16/20 [id number]    Lab Results:  Results for orders placed or performed during the hospital encounter of 03/14/20 (from the past 48 hour(s))  Rapid urine drug screen (hospital performed)     Status: Abnormal   Collection Time: 03/14/20 10:44 PM  Result Value Ref Range   Opiates NONE DETECTED NONE DETECTED   Cocaine NONE DETECTED NONE DETECTED   Benzodiazepines NONE DETECTED NONE DETECTED   Amphetamines NONE DETECTED NONE DETECTED   Tetrahydrocannabinol POSITIVE (A) NONE DETECTED   Barbiturates NONE DETECTED NONE DETECTED    Comment: (NOTE) DRUG SCREEN FOR MEDICAL PURPOSES ONLY.  IF CONFIRMATION IS NEEDED FOR ANY PURPOSE, NOTIFY LAB WITHIN 5 DAYS.  LOWEST DETECTABLE LIMITS FOR URINE DRUG SCREEN Drug Class                     Cutoff (ng/mL) Amphetamine and metabolites    1000 Barbiturate and metabolites    200 Benzodiazepine                 200 Tricyclics and metabolites      300 Opiates and metabolites        300 Cocaine and metabolites        300 THC                            50 Performed at [facility], [address] [place], [place] [address]   Pregnancy, urine  Status: Abnormal   Collection Time: 03/14/20 10:44 PM  Result Value Ref Range   Preg Test, Ur POSITIVE (A) NEGATIVE    Comment:        THE SENSITIVITY OF THIS METHODOLOGY IS >20 mIU/mL. Performed at [facility], [address], [place], [place] [address]   SARS Coronavirus 2 by RT PCR (hospital order, performed in [facility] hospital lab) Nasopharyngeal Nasopharyngeal Swab     Status: None   Collection Time: 03/15/20 12:36 AM   Specimen: Nasopharyngeal Swab  Result Value Ref Range   SARS Coronavirus 2 NEGATIVE NEGATIVE    Comment: (NOTE) SARS-CoV-2 target nucleic acids are NOT DETECTED.  The SARS-CoV-2 RNA is generally detectable in upper and lower respiratory specimens during the acute phase of infection. The lowest concentration of SARS-CoV-2 viral copies this assay can detect is 250 copies / mL. A negative result does not preclude SARS-CoV-2 infection and should not be used as the sole basis for treatment or other patient management decisions.  A negative result may occur with improper specimen collection / handling, submission of specimen other than nasopharyngeal swab, presence of viral mutation(s) within the areas targeted by this assay, and inadequate number of viral copies (<250 copies / mL). A negative result must be combined with clinical observations, patient history, and epidemiological information.  Fact Sheet for Patients:   BoilerBrush.com.cy  Fact Sheet for Healthcare Providers: https://pope.com/  This test is not yet approved or  cleared by the [place] FDA and has been authorized for detection and/or diagnosis of SARS-CoV-2 by FDA under an Emergency Use  Authorization (EUA).  This EUA will remain in effect (meaning this test can be used) for the duration of the COVID-19 declaration under Section 564(b)(1) of the Act, 21 U.S.C. section 360bbb-3(b)(1), unless the authorization is terminated or revoked sooner.  Performed at [facility], [address], [place], [place] [address]   Comprehensive metabolic panel     Status: Abnormal   Collection Time: 03/15/20 12:48 AM  Result Value Ref Range   Sodium 137 135 - 145 mmol/L   Potassium 3.9 3.5 - 5.1 mmol/L   Chloride 99 98 - 111 mmol/L   CO2 24 22 - 32 mmol/L   Glucose, Bld 111 (H) 70 - 99 mg/dL    Comment: Glucose reference range applies only to samples taken after fasting for at least 8 hours.   BUN 10 6 - 20 mg/dL   Creatinine, Ser 0.98 0.44 - 1.00 mg/dL   Calcium 9.4 8.9 - 11.9 mg/dL   Total Protein 7.6 6.5 - 8.1 g/dL   Albumin 4.0 3.5 - 5.0 g/dL   AST 25 15 - 41 U/L   ALT 45 (H) 0 - 44 U/L   Alkaline Phosphatase 79 38 - 126 U/L   Total Bilirubin 0.8 0.3 - 1.2 mg/dL   GFR calc non Af Amer >60 >60 mL/min   GFR calc Af Amer >60 >60 mL/min   Anion gap 14 5 - 15    Comment: Performed at [facility], [address] [place], [place] [address]  Ethanol     Status: None   Collection Time: 03/15/20 12:48 AM  Result Value Ref Range   Alcohol, Ethyl (B) <10 <10 mg/dL    Comment: (NOTE) Lowest detectable limit for serum alcohol is 10 mg/dL.  For medical purposes only. Performed at [facility], [address] [place], [place] [address]   Salicylate level  Status: Abnormal   Collection Time: 03/15/20 12:48 AM  Result Value Ref Range   Salicylate Lvl <7.0 (L) 7.0 - 30.0 mg/dL    Comment: Performed at [facility], [address] [place], [place] [address]  Acetaminophen level     Status: Abnormal   Collection Time: 03/15/20 12:48 AM  Result Value Ref Range   Acetaminophen (Tylenol), Serum <10 (L) 10 -  30 ug/mL    Comment: (NOTE) Therapeutic concentrations vary significantly. A range of 10-30 ug/mL  may be an effective concentration for many patients. However, some  are best treated at concentrations outside of this range. Acetaminophen concentrations >150 ug/mL at 4 hours after ingestion  and >50 ug/mL at 12 hours after ingestion are often associated with  toxic reactions.  Performed at [facility], [address] [place], [place] [address]   cbc     Status: Abnormal   Collection Time: 03/15/20 12:48 AM  Result Value Ref Range   WBC 11.0 (H) 4.0 - 10.5 K/uL   RBC 4.37 3.87 - 5.11 MIL/uL   Hemoglobin 13.5 12.0 - 15.0 g/dL   HCT 78.2 36 - 46 %   MCV 91.3 80.0 - 100.0 fL   MCH 30.9 26.0 - 34.0 pg   MCHC 33.8 30.0 - 36.0 g/dL   RDW 42.3 53.6 - 14.4 %   Platelets 270 150 - 400 K/uL   nRBC 0.0 0.0 - 0.2 %    Comment: Performed at [facility], [address] [place], [place] [address]    Blood Alcohol level:  Lab Results  Component Value Date   ETH <10 03/15/2020   ETH <10 02/27/2020    Metabolic Disorder Labs: No results found for: HGBA1C, MPG Lab Results  Component Value Date   PROLACTIN 5.3 02/27/2020   Lab Results  Component Value Date   CHOL 180 02/27/2020   TRIG 65 02/27/2020   HDL 49 02/27/2020   CHOLHDL 3.7 02/27/2020   VLDL 13 02/27/2020   LDLCALC 118 (H) 02/27/2020    Physical Findings: AIMS: Facial and Oral Movements Muscles of Facial Expression: None, normal Lips and Perioral Area: None, normal Jaw: None, normal Tongue: None, normal,Extremity Movements Upper (arms, wrists, hands, fingers): None, normal Lower (legs, knees, ankles, toes): None, normal, Trunk Movements Neck, shoulders, hips: None, normal, Overall Severity Severity of abnormal movements (highest score from questions above): None, normal Incapacitation due to abnormal movements: None, normal Patient's awareness of abnormal movements (rate only  patient's report): No Awareness, Dental Status Current problems with teeth and/or dentures?: No Does patient usually wear dentures?: No  CIWA:  CIWA-Ar Total: 3 COWS:  COWS Total Score: 5  Musculoskeletal: Strength & Muscle Tone: within normal limits Gait & Station: normal Patient leans: N/A  Psychiatric Specialty Exam: Physical Exam Vitals and nursing note reviewed.  Constitutional:      Appearance: Normal appearance.  HENT:     Head: Normocephalic and atraumatic.  Cardiovascular:     Rate and Rhythm: Normal rate.  Musculoskeletal:        General: Normal range of motion.  Neurological:     General: No focal deficit present.     Mental Status: She is alert and oriented to person, place, and time.  Psychiatric:        Attention and Perception: Attention and perception normal.        Mood and Affect: Mood is anxious and depressed.        Speech: Speech  normal.        Behavior: Behavior is cooperative.        Thought Content: Thought content includes suicidal ideation.        Cognition and Memory: Memory normal.        Judgment: Judgment is impulsive.     Review of Systems  Constitutional: Positive for fatigue.  HENT: Negative.   Eyes: Negative.   Musculoskeletal: Negative.   Neurological: Negative.   Psychiatric/Behavioral: Positive for dysphoric mood and suicidal ideas. The patient is nervous/anxious.     Blood pressure 102/63, pulse 93, temperature 98.4 F (36.9 C), temperature source Oral, resp. rate 18, height 5\' 6"  (1.676 m), weight 69.9 kg, last menstrual period 02/12/2020, SpO2 99 %.Body mass index is 24.86 kg/m.  General Appearance: Disheveled  Eye Contact:  Fair  Speech:  Normal Rate  Volume:  Normal  Mood:  Anxious, Depressed and Hopeless  Affect:  Appropriate  Thought Process:  Linear and Descriptions of Associations: Intact  Orientation:  Full (Time, Place, and Person)  Thought Content:  Logical  Suicidal Thoughts:  Yes.  with intent/plan  Homicidal  Thoughts:  No  Memory:  Immediate;   Good Recent;   Good Remote;   Good  Judgement:  Fair  Insight:  Fair  Psychomotor Activity:  Normal  Concentration:  Concentration: Good and Attention Span: Good  Recall:  Good  Fund of Knowledge:  Good  Language:  Good  Akathisia:  Negative  Handed:  Right  AIMS (if indicated):     Assets:  Communication Skills Housing Resilience Social Support Transportation  ADL's:  Intact  Cognition:  WNL  Sleep:  Number of Hours: 6    Treatment Plan Summary: Daily contact with patient to assess and evaluate symptoms and progress in treatment. Plan : Patient is seen and examined. ROMEY COHEA is a 24 year old female with past psychiatric history of depression, generalized anxiety disorder, borderline personality disorder presented as a voluntary walk in at [facility] with suicidal ideations and a plan to overdose on 03/14/2020. She was sent to  [facility] where she admitted to suicidal ideation over the past few days, worsing, leading to overdose attempt on 03/14/2020@ 16:30.  She took 5 tablets of 50 mg Trazodone @ 16:30  to harm herself.  Diagnosis: 1.  MDD. 2.  Hypothyroidism. 3.  Pregnant. 4.  Adjustment disorder 5.  Generalized anxiety disorder 6.  Delta 8 and vape dependence  7. Borderline personality disorder  Pertinent findings on examination today: 1.  Patient is overwhelmed 2.  Still suicidal with a plan 3.  Good sleep and appetite  Plan:  Scheduled medications:  1. Zoloft 125 mg. 2. Prenatal vitamins 1 tablet.  PRN's :  3. Tylenol 650 mg for mild pain 4. Maalox 30 ml for indigestion 5.Benadryl 25 mg for itching, allergies. 6.Benadryl 50 mg for insomnia. 7. Milk of magnesia 30 ml for mild constipation.   Labs:  8. Ordered 03/16/2020 transvaginal 9. Ordered Free T3 and free T4 as her TSH on 02/27/2020 was 7.015. 10. EKG  Psychosocial:  11. Encourgement to attend group therapies. 12. Appreciate the social workers help with  clarifying home situation. 13. Disposition in progress.    04/28/2020, MD 03/16/2020, 9:56 AM

## 2020-03-16 NOTE — Progress Notes (Signed)
   03/16/20 2040  COVID-19 Daily Checkoff  Have you had a fever (temp > 37.80C/100F)  in the past 24 hours?  No  COVID-19 EXPOSURE  Have you traveled outside the state in the past 14 days? No  Have you been in contact with someone with a confirmed diagnosis of COVID-19 or PUI in the past 14 days without wearing appropriate PPE? No  Have you been living in the same home as a person with confirmed diagnosis of COVID-19 or a PUI (household contact)? No  Have you been diagnosed with COVID-19? No

## 2020-03-16 NOTE — Progress Notes (Signed)
   03/16/20 1242  Vital Signs  Temp 98.4 F (36.9 C)  Temp Source Oral  Pulse Rate 62  Pulse Rate Source Dinamap  Resp 16  BP (!) 111/59  BP Location Left Arm  BP Method Automatic  Patient Position (if appropriate) Sitting   D: Patient denies SI/HI/AVH. Patient rate anxiety 3/10 and depression 7/10. Patient out in open areas.   Fluids encouraged. A:  Patient took scheduled medicine.  Support and encouragement provided Routine safety checks conducted every 15 minutes. Patient  Informed to notify staff with any concerns.   R: Safety maintained.
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
## 2020-03-16 NOTE — Tx Team (Signed)
Interdisciplinary Treatment and Diagnostic Plan Update  03/16/2020 Time of Session: 1025  Laura Lambert MRN: [id number]  Principal Diagnosis: Suicidal ideation  Secondary Diagnoses: Principal Problem:   Suicidal ideation Active Problems:   Social phobia   Generalized anxiety disorder   Insomnia   Borderline personality disorder ([place])   MDD (major depressive disorder), recurrent severe, without psychosis ([place])   Pregnant state, incidental   [redacted] weeks gestation of pregnancy   Current Medications:  Current Facility-Administered Medications  Medication Dose Route Frequency Provider Last Rate Last Admin  . acetaminophen (TYLENOL) tablet 650 mg  650 mg Oral Q6H PRN Sharma Covert, MD      . alum & mag hydroxide-simeth (MAALOX/MYLANTA) 200-200-20 MG/5ML suspension 30 mL  30 mL Oral Q4H PRN Sharma Covert, MD      . diphenhydrAMINE (BENADRYL) capsule 25 mg  25 mg Oral Q6H PRN Sharma Covert, MD      . diphenhydrAMINE (BENADRYL) capsule 50 mg  50 mg Oral QHS PRN Sharma Covert, MD      . influenza vac split quadrivalent PF (FLUARIX) injection 0.5 mL  0.5 mL Intramuscular Tomorrow-1000 Mallie Darting, Cordie Grice, MD      . magnesium hydroxide (MILK OF MAGNESIA) suspension 30 mL  30 mL Oral Daily PRN Sharma Covert, MD      . prenatal multivitamin tablet 1 tablet  1 tablet Oral Q1200 Sharma Covert, MD      . sertraline (ZOLOFT) tablet 125 mg  125 mg Oral Daily Dagar, Meredith Staggers, MD   125 mg at 03/16/20 0998   PTA Medications: Medications Prior to Admission  Medication Sig Dispense Refill Last Dose  . ARIPiprazole (ABILIFY) 15 MG tablet Take 15 mg by mouth daily.     . Omega-3 Fatty Acids (FISH OIL PO) Take 1 capsule by mouth daily.     . Pseudoeph-Doxylamine-DM-APAP (DAYQUIL/NYQUIL COLD/FLU RELIEF PO) Take 1 capsule by mouth as needed (congestion).     . sertraline (ZOLOFT) 50 MG tablet Take 125 mg by mouth at bedtime.      . traZODone (DESYREL) 50 MG tablet Take 1-2  tabs po QHS prn insomnia (Patient taking differently: Take 50-100 mg by mouth at bedtime as needed for sleep. ) 180 tablet 0     Patient Stressors: Financial difficulties Marital or family conflict Occupational concerns  Patient Strengths: Ability for insight Active sense of humor Average or above average intelligence Capable of independent living Occupational psychologist fund of knowledge Motivation for treatment/growth Physical Health Supportive family/friends Work skills  Treatment Modalities: Medication Management, Group therapy, Case management,  1 to 1 session with clinician, Psychoeducation, Recreational therapy.   Physician Treatment Plan for Primary Diagnosis: Suicidal ideation Long Term Goal(s): Improvement in symptoms so as ready for discharge Improvement in symptoms so as ready for discharge   Short Term Goals: Ability to identify changes in lifestyle to reduce recurrence of condition will improve Ability to verbalize feelings will improve Ability to disclose and discuss suicidal ideas Ability to demonstrate self-control will improve Ability to identify and develop effective coping behaviors will improve Ability to maintain clinical measurements within normal limits will improve Compliance with prescribed medications will improve Ability to identify triggers associated with substance abuse/mental health issues will improve Ability to identify changes in lifestyle to reduce recurrence of condition will improve Ability to verbalize feelings will improve Ability to disclose and discuss suicidal ideas Ability to demonstrate self-control will improve Ability to identify and develop effective  coping behaviors will improve Ability to maintain clinical measurements within normal limits will improve Compliance with prescribed medications will improve Ability to identify triggers associated with substance abuse/mental health issues will  improve  Medication Management: Evaluate patient's response, side effects, and tolerance of medication regimen.  Therapeutic Interventions: 1 to 1 sessions, Unit Group sessions and Medication administration.  Evaluation of Outcomes: Not Met  Physician Treatment Plan for Secondary Diagnosis: Principal Problem:   Suicidal ideation Active Problems:   Social phobia   Generalized anxiety disorder   Insomnia   Borderline personality disorder (HCC)   MDD (major depressive disorder), recurrent severe, without psychosis ([place])   Pregnant state, incidental   [redacted] weeks gestation of pregnancy  Long Term Goal(s): Improvement in symptoms so as ready for discharge Improvement in symptoms so as ready for discharge   Short Term Goals: Ability to identify changes in lifestyle to reduce recurrence of condition will improve Ability to verbalize feelings will improve Ability to disclose and discuss suicidal ideas Ability to demonstrate self-control will improve Ability to identify and develop effective coping behaviors will improve Ability to maintain clinical measurements within normal limits will improve Compliance with prescribed medications will improve Ability to identify triggers associated with substance abuse/mental health issues will improve Ability to identify changes in lifestyle to reduce recurrence of condition will improve Ability to verbalize feelings will improve Ability to disclose and discuss suicidal ideas Ability to demonstrate self-control will improve Ability to identify and develop effective coping behaviors will improve Ability to maintain clinical measurements within normal limits will improve Compliance with prescribed medications will improve Ability to identify triggers associated with substance abuse/mental health issues will improve     Medication Management: Evaluate patient's response, side effects, and tolerance of medication regimen.  Therapeutic Interventions: 1 to  1 sessions, Unit Group sessions and Medication administration.  Evaluation of Outcomes: Not Met   RN Treatment Plan for Primary Diagnosis: Suicidal ideation Long Term Goal(s): Knowledge of disease and therapeutic regimen to maintain health will improve  Short Term Goals: Ability to remain free from injury will improve, Ability to verbalize feelings will improve, Ability to disclose and discuss suicidal ideas and Ability to identify and develop effective coping behaviors will improve  Medication Management: RN will administer medications as ordered by provider, will assess and evaluate patient's response and provide education to patient for prescribed medication. RN will report any adverse and/or side effects to prescribing provider.  Therapeutic Interventions: 1 on 1 counseling sessions, Psychoeducation, Medication administration, Evaluate responses to treatment, Monitor vital signs and CBGs as ordered, Perform/monitor CIWA, COWS, AIMS and Fall Risk screenings as ordered, Perform wound care treatments as ordered.  Evaluation of Outcomes: Not Met   LCSW Treatment Plan for Primary Diagnosis: Suicidal ideation Long Term Goal(s): Safe transition to appropriate next level of care at discharge, Engage patient in therapeutic group addressing interpersonal concerns.  Short Term Goals: Engage patient in aftercare planning with referrals and resources, Increase social support, Increase ability to appropriately verbalize feelings, Increase emotional regulation and Facilitate acceptance of mental health diagnosis and concerns  Therapeutic Interventions: Assess for all discharge needs, 1 to 1 time with Social worker, Explore available resources and support systems, Assess for adequacy in community support network, Educate family and significant other(s) on suicide prevention, Complete Psychosocial Assessment, Interpersonal group therapy.  Evaluation of Outcomes: Not Met   Progress in  Treatment: Attending groups: No. and As evidenced by:  Recently admitted. Participating in groups: No. and As evidenced by:  see above Taking medication as prescribed: Yes. Toleration medication: Yes. Family/Significant other contact made: No, will contact:  SW will discuss with Pt contacting her parents. Patient understands diagnosis: Yes. Discussing patient identified problems/goals with staff: Yes. Medical problems stabilized or resolved: Yes. Denies suicidal/homicidal ideation: Yes. Issues/concerns per patient self-inventory: No. Other: None  New problem(s) identified: No, Describe:  None  New Short Term/Long Term Goal(s): SW encouraged Pt to attend groups in order to continue processing reason for admission. SW discussed with Pt ways to go about making decision related to her pregnancy.   Patient Goals: "Figure out what to do about my pregnancy."  Discharge Plan or Barriers: SW will continue to assess.  Reason for Continuation of Hospitalization: Anxiety Depression Medication stabilization  Estimated Length of Stay: 3-5 Days  Attendees: Patient: Laura Lambert 03/16/2020 11:12 AM  Physician: Dr. Myles Lipps 03/16/2020 11:12 AM  Nursing:  03/16/2020 11:12 AM  RN Care Manager: 03/16/2020 11:12 AM  Social Worker: Freddi Che, LCSW 03/16/2020 11:12 AM  Recreational Therapist:  03/16/2020 11:12 AM  Other:  03/16/2020 11:12 AM  Other:  03/16/2020 11:12 AM  Other: 03/16/2020 11:12 AM    Scribe for Treatment Team: Freddi Che, LCSW 03/16/2020 11:12 AM

## 2020-03-16 NOTE — Progress Notes (Signed)
Psychoeducational Group Note  Date:  03/16/2020 Time:  2115  Group Topic/Focus:  Wrap-Up Group:   The focus of this group is to help patients review their daily goal of treatment and discuss progress on daily workbooks.  Participation Level: Did Not Attend  Participation Quality:  Not Applicable  Affect:  Not Applicable  Cognitive:  Not Applicable  Insight:  Not Applicable  Engagement in Group: Not Applicable  Additional Comments:  The patient did not attend group this evening since she was asleep in bed.   Hazle Coca S 03/16/2020, 9:15 PM
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
## 2020-03-17 ENCOUNTER — Ambulatory Visit (HOSPITAL_COMMUNITY): Payer: Federal, State, Local not specified - PPO | Attending: Psychiatry

## 2020-03-17 ENCOUNTER — Ambulatory Visit (HOSPITAL_COMMUNITY): Payer: Federal, State, Local not specified - PPO

## 2020-03-17 DIAGNOSIS — Z3A01 Less than 8 weeks gestation of pregnancy: Secondary | ICD-10-CM | POA: Insufficient documentation

## 2020-03-17 DIAGNOSIS — Z3491 Encounter for supervision of normal pregnancy, unspecified, first trimester: Secondary | ICD-10-CM | POA: Insufficient documentation

## 2020-03-17 LAB — T3 UPTAKE: T3 Uptake Ratio: 23 % — ABNORMAL LOW (ref 24–39)

## 2020-03-17 LAB — T3, FREE: T3, Free: 3.1 pg/mL (ref 2.0–4.4)

## 2020-03-17 NOTE — [facility] Suicide Risk Assessment (Signed)
[facility] INPATIENT:  Family/Significant Other Suicide Prevention Education  Suicide Prevention Education:  Education Completed; Lanora Manis and Jaquitta Dupriest [phone]), mother and father, have been identified by the patient as the family member/significant other with whom the patient will be residing, and identified as the person(s) who will aid the patient in the event of a mental health crisis (suicidal ideations/suicide attempt).  With written consent from the patient, the family member/significant other has been provided the following suicide prevention education, prior to the and/or following the discharge of the patient.  The suicide prevention education provided includes the following:  Suicide risk factors  Suicide prevention and interventions  National Suicide Hotline telephone number  [facility] assessment telephone number  [facility] Emergency Assistance 911  [facility] and/or Residential Mobile Crisis Unit telephone number  Request made of family/significant other to:  Remove weapons (e.g., guns, rifles, knives), all items previously/currently identified as safety concern.    Remove drugs/medications (over-the-counter, prescriptions, illicit drugs), all items previously/currently identified as a safety concern.  CSW completed SPE with Lanora Manis and Cipriana Biller during family meeting. Pt was present as well. Mother and father are supportive. They are informed of Pt's mental health services as well as medication regimen. Pt will be residing with parents post discharge as part of safety plan. SPI pamphlet information discussed. Mother and father confirmed that there are no weapons in the home. The family member/significant other verbalizes understanding of the suicide prevention education information provided. The family member/significant other agrees to remove the items of safety concern listed above.  Joelyn Oms [place], LCSW 03/17/2020, 12:43 PM

## 2020-03-17 NOTE — [facility] Counselor (Signed)
FAMILY MEETING:   CSW conducted family meeting via telehealth with Pt's mother and father, utilizing hospital equipment. Pt, Pt's mother and father, and Dr. Gerarda Fraction were present. CSW reminded Pt and family of the purpose of today's meeting. CSW started by having Pt shared current mood and how she feels she's improved since admission. Pt stated that she is feeling really good. She reports feeling calmer as well as feeling better regarding her ability to make decisions.   Pt shared concern that mother and father would disown if she chose the route of abortion for her pregnancy. Mother and father stated that they love Pt and there was nothing Pt could do that would change that. To promote clarification of Pt's concern, CSW specifically referenced other potential consequences if Pt were to have an abortion. CSW listed specifics such as: no longer financially supporting Pt, Pt no longer being able to reside in the home or a change in family dynamic. Mother and father confirmed that though they would not agree with the decision to terminate the pregnancy, it would not affect the dynamic nor the aforementioned specifics. Mother and father further verbalized their support in Pt's treatment and recovery post discharge. Parents were given the opportunity to ask psychiatrist questions regarding Pt's medication changes. Psychiatrist educated family regarding change in medication for the safety of Pt's unborn child. Pt has adjusted to medication well and denies any adverse side effects.  Discharge plans were discussed; Pt and family were made aware of discharge planning services and care coordination with Pt's current OP providers. Mother and father are able to provide transportation post discharge, with an anticipated date of 03/18/2020.   Jacinta Shoe, LCSW

## 2020-03-17 NOTE — Progress Notes (Signed)
Adult Psychoeducational Group Note  Date:  03/17/2020 Time:  6:09 PM  Group Topic/Focus:  Building Self Esteem:   The Focus of this group is helping patients become aware of the effects of self-esteem on their lives, the things they and others do that enhance or undermine their self-esteem, seeing the relationship between their level of self-esteem and the choices they make and learning ways to enhance self-esteem. Conflict Resolution:   The focus of this group is to discuss the conflict resolution process and how it may be used upon discharge.  Participation Level:  Active  Participation Quality:  Appropriate  Affect:  Appropriate  Cognitive:  Alert and Appropriate  Insight: Appropriate and Good  Engagement in Group:  Engaged  Modes of Intervention:  Discussion  Additional Comments:  Pt attended group and participated in discussion.  Channell Quattrone R Margie Brink 03/17/2020, 6:09 PM

## 2020-03-17 NOTE — Discharge Summary (Addendum)
Physician Discharge Summary Note  Patient:  Laura Lambert is an 24 y.o., female MRN:  [id number] DOB:  03-12-1996 Patient phone:  [phone] (home)  Patient address:   [address] [place] [id number],  Total Time spent with patient: 30 minutes  Date of Admission:  03/15/2020 Date of Discharge: 03/18/2020  Reason for Admission: Laura PopMadeline A Speirs is a 24 year old female with past psychiatric history of depression, generalized anxiety disorder, borderline personality disorder presented as a [facility] with suicidal ideations and a plan to overdose on 03/14/2020. She was sent to  [facility] where she admitted to suicidal ideation over the past few days, worsing, leading to overdose attempt on 03/14/2020@ 16:30.  She took 5 tablets of 50 mg Trazodone @ 16:30  to harm herself. She was admitted for stabilization and further evaluation.  Principal Problem: Suicidal ideation Discharge Diagnoses: Principal Problem:   Suicidal ideation Active Problems:   Social phobia   Generalized anxiety disorder   Insomnia   Borderline personality disorder (HCC)   MDD (major depressive disorder), recurrent severe, without psychosis (HCC)   Pregnant state, incidental   [redacted] weeks gestation of pregnancy   Past Psychiatric History: Patient reports that this will be her fifth hospital admission for mental health issues.  History of depression, anxiety, and borderline personality disorder.  Patient reports that in the sixth grade she was being bullied at school and attempted to drown herself in the bathtub.  She also reports that at 24 years old she had cut her wrist while she was in the bathtub because she got caught shoplifting and was admitted. She overdosed on Klonopin, overdosed on Trazodone. [facility], before that hospitalization was [facility].  Past Medical History:  Past Medical History:  Diagnosis Date  . Anxiety   .  Depression     Past Surgical History:  Procedure Laterality Date  . DENTAL SURGERY     Family History:  Family History  Problem Relation Age of Onset  . Cancer Other   . Hyperlipidemia Other   . Hypertension Other   . Diabetes Other   . Heart attack Other   . Hypertension Father   . Depression Maternal Grandmother    Family Psychiatric  History: Grandmother attempted suicide Social History:  Social History   Substance and Sexual Activity  Alcohol Use Never   Comment: Occ     Social History   Substance and Sexual Activity  Drug Use Never    Social History   Socioeconomic History  . Marital status: Single    Spouse name: Not on file  . Number of children: Not on file  . Years of education: Not on file  . Highest education level: Not on file  Occupational History  . Not on file  Tobacco Use  . Smoking status: Never Smoker  . Smokeless tobacco: Never Used  Vaping Use  . Vaping Use: Never used  Substance and Sexual Activity  . Alcohol use: Never    Comment: Occ  . Drug use: Never  . Sexual activity: Yes    Birth control/protection: None  Other Topics Concern  . Not on file  Social History Narrative  . Not on file   Social Determinants of Health   Financial Resource Strain:   . Difficulty of Paying Living Expenses: Not on file  Food Insecurity:   . Worried About Programme researcher, broadcasting/film/videounning Out of Food in the Last Year: Not on  file  . Ran Out of Food in the Last Year: Not on file  Transportation Needs:   . Lack of Transportation (Medical): Not on file  . Lack of Transportation (Non-Medical): Not on file  Physical Activity:   . Days of Exercise per Week: Not on file  . Minutes of Exercise per Session: Not on file  Stress:   . Feeling of Stress : Not on file  Social Connections:   . Frequency of Communication with Friends and Family: Not on file  . Frequency of Social Gatherings with Friends and Family: Not on file  . Attends Religious Services: Not on file  . Active  Member of Clubs or Organizations: Not on file  . Attends Banker Meetings: Not on file  . Marital Status: Not on file    Hospital Course:  Patient was restarted on her Zoloft 125 mg p.o. night. UDS was positive for benzodiazepines and cannabis.  EKG was done on 11/11/2019 and QTC was 440 with sinus rhythm. Patient admits to vaping with nicotine product but she is refusing to use a nicotine patch or gum at that time.  She was provided with prenatal vitamins.  A family meeting was conducted by CSW via telehealth with Pt's mother and father on 03/17/2020. Pt shared concern that mother and father would disown if she chose the route of abortion for her pregnancy. Mother and father stated that they love Pt and there was nothing Pt could do that would change that. To promote clarification of Pt's concern, CSW specifically referenced other potential consequences if Pt were to have an abortion. CSW listed specifics such as: no longer financially supporting Pt, Pt no longer being able to reside in the home or a change in family dynamic. Mother and father confirmed that though they would not agree with the decision to terminate the pregnancy, it would not affect the dynamic nor the aforementioned specifics. Mother and father further verbalized their support in Pt's treatment and recovery post discharge.  On the day of discharge she presents with improved mood. She denies suicidal ideations, homicidal ideations. She is thankful for the all the help received during her stay, helped her to make her decision and she felt supported. She is leaving the unit in good spirits.  Discharge medications:  1. Zoloft 125 mg 2. Benadryl 50 mg 3. Benadryl 25 mg 4. Prenatal vitamins   Physical Findings: AIMS: Facial and Oral Movements Muscles of Facial Expression: None, normal Lips and Perioral Area: None, normal Jaw: None, normal Tongue: None, normal,Extremity Movements Upper (arms, wrists, hands, fingers):  None, normal Lower (legs, knees, ankles, toes): None, normal, Trunk Movements Neck, shoulders, hips: None, normal, Overall Severity Severity of abnormal movements (highest score from questions above): None, normal Incapacitation due to abnormal movements: None, normal Patient's awareness of abnormal movements (rate only patient's report): No Awareness, Dental Status Current problems with teeth and/or dentures?: No Does patient usually wear dentures?: No  CIWA:  CIWA-Ar Total: 3 COWS:  COWS Total Score: 5  Musculoskeletal: Strength & Muscle Tone: within normal limits Gait & Station: normal Patient leans: N/A  Psychiatric Specialty Exam: Physical Exam Vitals and nursing note reviewed.  Constitutional:      Appearance: Normal appearance.  HENT:     Head: Normocephalic and atraumatic.  Cardiovascular:     Rate and Rhythm: Normal rate.  Musculoskeletal:        General: Normal range of motion.  Neurological:     General: No focal deficit present.  Mental Status: She is alert and oriented to person, place, and time.  Psychiatric:        Attention and Perception: Attention and perception normal.        Mood and Affect: Mood is anxious. Mood is not depressed.        Speech: Speech normal.        Behavior: Behavior is cooperative.        Thought Content: Thought content does not include suicidal ideation.        Cognition and Memory: Memory normal.        Judgment: Judgment is not impulsive.     Review of Systems  Constitutional: Negative for fatigue.  HENT: Negative.   Eyes: Negative.   Musculoskeletal: Negative.   Neurological: Negative.   Psychiatric/Behavioral: Negative for dysphoric mood and suicidal ideas. The patient is nervous/anxious.     Blood pressure (!) 96/51, pulse 87, temperature 98.7 F (37.1 C), temperature source Oral, resp. rate 16, height 5\' 6"  (1.676 m), weight 69.9 kg, last menstrual period 02/12/2020, SpO2 99 %.Body mass index is 24.86 kg/m.  General  Appearance: Casual  Eye Contact:  Good  Speech:  Normal Rate  Volume:  Normal  Mood:  Anxious  Affect:  Appropriate  Thought Process:  Linear and Descriptions of Associations: Intact  Orientation:  Full (Time, Place, and Person)  Thought Content:  Logical  Suicidal Thoughts:  No  Homicidal Thoughts:  No  Memory:  Immediate;   Good Recent;   Good Remote;   Good  Judgement:  Good  Insight:  Fair  Psychomotor Activity:  Normal  Concentration:  Concentration: Good and Attention Span: Good  Recall:  Good  Fund of Knowledge:  Good  Language:  Good  Akathisia:  Negative  Handed:  Right  AIMS (if indicated):     Assets:  Communication Skills Desire for Improvement Financial Resources/Insurance Housing Resilience Social Support Transportation  ADL's:  Intact  Cognition:  WNL  Sleep:  Number of Hours: 6.75     Have you used any form of tobacco in the last 30 days? (Cigarettes, Smokeless Tobacco, Cigars, and/or Pipes): No  Has this patient used any form of tobacco in the last 30 days? (Cigarettes, Smokeless Tobacco, Cigars, and/or Pipes) Yes, N/A  Blood Alcohol level:  Lab Results  Component Value Date   ETH <10 03/15/2020   ETH <10 02/27/2020    Metabolic Disorder Labs:  No results found for: HGBA1C, MPG Lab Results  Component Value Date   PROLACTIN 5.3 02/27/2020   Lab Results  Component Value Date   CHOL 180 02/27/2020   TRIG 65 02/27/2020   HDL 49 02/27/2020   CHOLHDL 3.7 02/27/2020   VLDL 13 02/27/2020   LDLCALC 118 (H) 02/27/2020    See Psychiatric Specialty Exam and Suicide Risk Assessment completed by Attending Physician prior to discharge.  Discharge destination:  Home  Is patient on multiple antipsychotic therapies at discharge:  No   Has Patient had three or more failed trials of antipsychotic monotherapy by history:  No  Recommended Plan for Multiple Antipsychotic Therapies: NA  Discharge Instructions    Discharge patient   Complete by: As  directed    Discharge disposition: 01-Home or Self Care   Discharge patient date: 03/18/2020     Allergies as of 03/18/2020   No Known Allergies     Medication List    STOP taking these medications   ARIPiprazole 15 MG tablet Commonly known as: ABILIFY  DAYQUIL/NYQUIL COLD/FLU RELIEF PO   FISH OIL PO   traZODone 50 MG tablet Commonly known as: DESYREL     TAKE these medications     Indication  diphenhydrAMINE 50 MG capsule Commonly known as: BENADRYL Take 1 capsule (50 mg total) by mouth at bedtime as needed (insomnia).  Indication: Trouble Sleeping   diphenhydrAMINE 25 mg capsule Commonly known as: BENADRYL Take 1 capsule (25 mg total) by mouth every 6 (six) hours as needed for itching or allergies.  Indication: Hives   prenatal multivitamin Tabs tablet Take 1 tablet by mouth daily at 12 noon.  Indication: Pregnancy   sertraline 25 MG tablet Commonly known as: ZOLOFT Take 5 tablets (125 mg total) by mouth daily. What changed:   medication strength  when to take this  Indication: Major Depressive Disorder       Follow-up Information    [facility] Go on 03/23/2020.   Why: You have a telehealth appointment with your therapist on Wednesday, September 29th at [facility] information: [address] [place] [address] [phone]        [facility]. Go on 04/07/2020.   Why: You have a telehealth appointment with Dr. Aileen Pilot on October 14th at [facility] information: [address] [place], [place] [address]  Phone: [phone] Fax: [phone]              Follow-up recommendations:  Activity:  Normal Diet:  Normal  Comments: Prescriptions given at discharge.Patient agreeable to plan. Given opportunity to ask questions. Appears to feel comfortable with discharge denies any current suicidal or homicidal thought. Patient is also instructed prior to discharge to: Take all medications as  prescribed by his/her mental healthcare provider. Report any adverse effects and or reactions from the medicines to her outpatient provider promptly. Patient has been instructed & cautioned: To not engage in alcohol and or illegal drug use while on prescription medicines. In the event of worsening symptoms, patient is instructed to call the crisis hotline, 911 and or go to the nearest ED for appropriate evaluation and treatment of symptoms. To follow-up with her primary care provider for your other medical issues, concerns and or health care needs.   Signed: Arnoldo Lenis, MD 03/18/2020, 10:02 AM

## 2020-03-17 NOTE — [facility] Group Notes (Signed)
Occupational Therapy Group Note Date: 03/17/2020 Group Topic/Focus: Coping Skills  Group Description: Group encouraged increased engagement and participation through discussion and activity focused on "Building Happiness". Group discussed and identified six specific strategies to building happiness including identifying gratitude, acts of kindness, exercise, meditation, positive journal writing, and fostering relationships. Patients were then encouraged to practice and identify one strategy they found most useful that they could begin implementing into their daily routines.  Participation Level: Active   Participation Quality: Independent   Behavior: Cooperative and Interactive   Speech/Thought Process: Focused   Affect/Mood: Euthymic   Insight: Fair   Judgement: Fair   Individualization: Ashelyn was active and independent in her participation of discussion and activity. She identified her happiness as "my family and my cat" and identified fostering relationships as an area to improve on building her happiness. She identified having strong relationships with her family and good communication, though noted benefit of treatment team as a "go between" to improve communication.   Modes of Intervention: Activity, Discussion, Education and Socialization  Patient Response to Interventions:  Attentive, Engaged, Receptive and Interested   Plan: Continue to engage patient in OT groups 2 - 3x/week.   03/17/2020  Donne Hazel, MOT, OTR/L

## 2020-03-17 NOTE — Progress Notes (Signed)
Pt has been depressed, anxious, and apprehensive. Pt said her goal is "meeting with the social worker and my parents to see what to do about my pregnancy." She said her stressor has been her recent breakup with her boyfriend of 4 years. Pt said that her boyfriend wants her to get an abortion and she's "torn" with that decision. She also said that she would like to develop "reasons for not being suicidal." Pt provided active listening, reassurance, and support. Pt said that she has trouble falling asleep for which her PRN benadryl was administered and it was effective. Pt denies SI/HI and AVH. Q 15 min safety checks continue. Pt's safety has been maintained.   03/16/20 2040  Psych Admission Type (Psych Patients Only)  Admission Status Voluntary  Psychosocial Assessment  Patient Complaints Anxiety;Depression;Sadness;Worrying  Eye Contact Fair  Facial Expression Sad;Anxious  Affect Apprehensive;Depressed;Sad;Anxious  Speech Logical/coherent  Interaction Assertive  Motor Activity Other (Comment) (WNL)  Appearance/Hygiene Unremarkable  Behavior Characteristics Cooperative;Appropriate to situation;Calm;Anxious  Mood Depressed;Anxious;Sad;Pleasant  Thought Process  Coherency WDL  Content WDL  Delusions None reported or observed  Perception WDL  Hallucination None reported or observed  Judgment Poor  Confusion None  Danger to Self  Current suicidal ideation? Denies  Self-Injurious Behavior No self-injurious ideation or behavior indicators observed or expressed   Danger to Others  Danger to Others None reported or observed

## 2020-03-17 NOTE — Progress Notes (Signed)
   03/17/20 1000  Psych Admission Type (Psych Patients Only)  Admission Status Voluntary  Psychosocial Assessment  Patient Complaints Anxiety;Depression  Eye Contact Fair  Facial Expression Sad;Anxious  Affect Apprehensive;Depressed;Sad;Anxious  Speech Logical/coherent  Interaction Assertive  Motor Activity Other (Comment) (WNL)  Appearance/Hygiene Unremarkable  Behavior Characteristics Cooperative  Mood Depressed  Thought Process  Coherency WDL  Content WDL  Delusions None reported or observed  Perception WDL  Hallucination None reported or observed  Judgment Poor  Confusion None  Danger to Self  Current suicidal ideation? Denies  Self-Injurious Behavior No self-injurious ideation or behavior indicators observed or expressed   Danger to Others  Danger to Others None reported or observed

## 2020-03-17 NOTE — Progress Notes (Signed)
Patient rated her day as a 7 out of 10. She indicated that she had a good talk with her parents along with the Child psychotherapist. She states that she had her ultrasound appointment as well. Her goal for tomorrow is to get discharged.

## 2020-03-17 NOTE — Progress Notes (Signed)
[facility] MD Progress Note  03/17/2020 10:30 AM Laura Lambert  MRN:  [id number]   Subjective: Patient states she is still feeling better today . She states last evening she had a  conversation with her mother and she understands that her mother wants her to keep the baby. She states for now her decision is to keep the baby and then give up for adoption. She denies suicidal ideations today but states she is anxious about the family meeting this afternoon. She denies homicidal ideations, auditory or visual hallucinations. She states she ate her breakfast and it was really good. She states she slept good last night but has to take benadryl to help her go to sleep. She states that her medication is working out good for her and her outpatient psychiatrist was weaning her off the Abilify.  Objective: Patient is seen and examined. She is  alert, oriented * 3. She is polite and respectful on approach,dysphoric but improved from yesterday. She promises to contract for safety if requires. She does not seem like responding to the internal stimuli. Her blood pressure is (!) 111/59, pulse 62, temperature 98.4 F (36.9 C), temperature source Oral, resp. rate 16.  She slept 6.75 hours last night. Her free T3 and T4 are normal, T3 uptake ratio is decreased to 23. Her US ob transvaginal US shows Gestational sac containing yolk sac seen. Fetal pole currently not seen. This finding warrants a follow-up study in 10-14 days to assess for fetal pole and fetal heart activity. No subchorionic hemorrhage.  Principal Problem: Suicidal ideation Diagnosis: Principal Problem:   Suicidal ideation Active Problems:   Social phobia   Generalized anxiety disorder   Insomnia   Borderline personality disorder (HCC)   MDD (major depressive disorder), recurrent severe, without psychosis (HCC)   Pregnant state, incidental   [redacted] weeks gestation of pregnancy  Total Time spent with patient: 15 minutes  Past Psychiatric History: See H  & P  Past Medical History:  Past Medical History:  Diagnosis Date  . Anxiety   . Depression     Past Surgical History:  Procedure Laterality Date  . DENTAL SURGERY     Family History:  Family History  Problem Relation Age of Onset  . Cancer Other   . Hyperlipidemia Other   . Hypertension Other   . Diabetes Other   . Heart attack Other   . Hypertension Father   . Depression Maternal Grandmother    Family Psychiatric  History: See H & P Social History:  Social History   Substance and Sexual Activity  Alcohol Use Never   Comment: Occ     Social History   Substance and Sexual Activity  Drug Use Never    Social History   Socioeconomic History  . Marital status: Single    Spouse name: Not on file  . Number of children: Not on file  . Years of education: Not on file  . Highest education level: Not on file  Occupational History  . Not on file  Tobacco Use  . Smoking status: Never Smoker  . Smokeless tobacco: Never Used  Vaping Use  . Vaping Use: Never used  Substance and Sexual Activity  . Alcohol use: Never    Comment: Occ  . Drug use: Never  . Sexual activity: Yes    Birth control/protection: None  Other Topics Concern  . Not on file  Social History Narrative  . Not on file   Social Determinants of Health  Financial Resource Strain:   . Difficulty of Paying Living Expenses: Not on file  Food Insecurity:   . Worried About Programme researcher, broadcasting/film/videounning Out of Food in the Last Year: Not on file  . Ran Out of Food in the Last Year: Not on file  Transportation Needs:   . Lack of Transportation (Medical): Not on file  . Lack of Transportation (Non-Medical): Not on file  Physical Activity:   . Days of Exercise per Week: Not on file  . Minutes of Exercise per Session: Not on file  Stress:   . Feeling of Stress : Not on file  Social Connections:   . Frequency of Communication with Friends and Family: Not on file  . Frequency of Social Gatherings with Friends and Family: Not  on file  . Attends Religious Services: Not on file  . Active Member of Clubs or Organizations: Not on file  . Attends BankerClub or Organization Meetings: Not on file  . Marital Status: Not on file   Additional Social History:    Pain Medications: see MAR Prescriptions: see MAR Over the Counter: see MAR History of alcohol / drug use?: No history of alcohol / drug abuse Longest period of sobriety (when/how long): none user Negative Consequences of Use: Financial, Personal relationships Withdrawal Symptoms: Other (Comment) (denied withdrawals)                    Sleep: Good  Appetite:  Good  Current Medications: Current Facility-Administered Medications  Medication Dose Route Frequency Provider Last Rate Last Admin  . acetaminophen (TYLENOL) tablet 650 mg  650 mg Oral Q6H PRN Antonieta Pertlary, Greg Lawson, MD      . alum & mag hydroxide-simeth (MAALOX/MYLANTA) 200-200-20 MG/5ML suspension 30 mL  30 mL Oral Q4H PRN Antonieta Pertlary, Greg Lawson, MD      . diphenhydrAMINE (BENADRYL) capsule 25 mg  25 mg Oral Q6H PRN Antonieta Pertlary, Greg Lawson, MD      . diphenhydrAMINE (BENADRYL) capsule 50 mg  50 mg Oral QHS PRN Antonieta Pertlary, Greg Lawson, MD   50 mg at 03/16/20 2133  . influenza vac split quadrivalent PF (FLUARIX) injection 0.5 mL  0.5 mL Intramuscular Tomorrow-1000 Jola Babinskilary, Marlane MingleGreg Lawson, MD      . magnesium hydroxide (MILK OF MAGNESIA) suspension 30 mL  30 mL Oral Daily PRN Antonieta Pertlary, Greg Lawson, MD      . prenatal multivitamin tablet 1 tablet  1 tablet Oral Q1200 Antonieta Pertlary, Greg Lawson, MD   1 tablet at 03/16/20 1243  . sertraline (ZOLOFT) tablet 125 mg  125 mg Oral Daily Gia Lusher, Geralynn RileAnjali, MD   125 mg at 03/17/20 0900    Lab Results:  Results for orders placed or performed during the hospital encounter of 03/15/20 (from the past 48 hour(s))  T3, free     Status: None   Collection Time: 03/16/20  5:59 PM  Result Value Ref Range   T3, Free 3.1 2.0 - 4.4 pg/mL    Comment: (NOTE) Performed At: [facility] [address] [place], [place] [id number] Jolene SchimkeNagendra Sanjai MD NW:[id number]:[phone]   T4, free     Status: None   Collection Time: 03/16/20  5:59 PM  Result Value Ref Range   Free T4 1.01 0.61 - 1.12 ng/dL    Comment: (NOTE) Biotin ingestion may interfere with free T4 tests. If the results are inconsistent with the TSH level, previous test results, or the clinical presentation, then consider biotin interference. If needed, order repeat testing after stopping biotin. Performed at  [facility], [address], [place], [place] [address]   T3 uptake     Status: Abnormal   Collection Time: 03/16/20  5:59 PM  Result Value Ref Range   T3 Uptake Ratio 23 (L) 24 - 39 %    Comment: (NOTE) Performed At: [facility] [address] [place], [place] [id number] Jolene Schimke MD XN:[id number]     Blood Alcohol level:  Lab Results  Component Value Date   ETH <10 03/15/2020   ETH <10 02/27/2020    Metabolic Disorder Labs: No results found for: HGBA1C, MPG Lab Results  Component Value Date   PROLACTIN 5.3 02/27/2020   Lab Results  Component Value Date   CHOL 180 02/27/2020   TRIG 65 02/27/2020   HDL 49 02/27/2020   CHOLHDL 3.7 02/27/2020   VLDL 13 02/27/2020   LDLCALC 118 (H) 02/27/2020    Physical Findings: AIMS: Facial and Oral Movements Muscles of Facial Expression: None, normal Lips and Perioral Area: None, normal Jaw: None, normal Tongue: None, normal,Extremity Movements Upper (arms, wrists, hands, fingers): None, normal Lower (legs, knees, ankles, toes): None, normal, Trunk Movements Neck, shoulders, hips: None, normal, Overall Severity Severity of abnormal movements (highest score from questions above): None, normal Incapacitation due to abnormal movements: None, normal Patient's awareness of abnormal movements (rate only patient's report): No Awareness, Dental Status Current problems with teeth and/or dentures?: No Does patient usually wear dentures?: No  CIWA:   CIWA-Ar Total: 3 COWS:  COWS Total Score: 5  Musculoskeletal: Strength & Muscle Tone: within normal limits Gait & Station: normal Patient leans: N/A  Psychiatric Specialty Exam: Physical Exam Vitals and nursing note reviewed.  Constitutional:      Appearance: Normal appearance.  HENT:     Head: Normocephalic and atraumatic.  Cardiovascular:     Rate and Rhythm: Normal rate.  Musculoskeletal:        General: Normal range of motion.  Neurological:     General: No focal deficit present.     Mental Status: She is alert and oriented to person, place, and time.  Psychiatric:        Attention and Perception: Attention and perception normal.        Mood and Affect: Mood is anxious. Mood is not depressed.        Speech: Speech normal.        Behavior: Behavior is cooperative.        Thought Content: Thought content does not include suicidal ideation.        Cognition and Memory: Memory normal.        Judgment: Judgment is not impulsive.     Review of Systems  Constitutional: Negative for fatigue.  HENT: Negative.   Eyes: Negative.   Musculoskeletal: Negative.   Neurological: Negative.   Psychiatric/Behavioral: Positive for dysphoric mood. Negative for suicidal ideas. The patient is nervous/anxious.     Blood pressure (!) 111/59, pulse 62, temperature 98.4 F (36.9 C), temperature source Oral, resp. rate 16, height 5\' 6"  (1.676 m), weight 69.9 kg, last menstrual period 02/12/2020, SpO2 99 %.Body mass index is 24.86 kg/m.  General Appearance: Casual  Eye Contact:  Fair  Speech:  Normal Rate  Volume:  Normal  Mood:  Anxious and Depressed  Affect:  Appropriate  Thought Process:  Linear and Descriptions of Associations: Intact  Orientation:  Full (Time, Place, and Person)  Thought Content:  Logical  Suicidal Thoughts:  No  Homicidal Thoughts:  No  Memory:  Immediate;   Good Recent;   Good Remote;   Good  Judgement:  Fair  Insight:  Fair  Psychomotor Activity:  Normal   Concentration:  Concentration: Good and Attention Span: Good  Recall:  Good  Fund of Knowledge:  Good  Language:  Good  Akathisia:  Negative  Handed:  Right  AIMS (if indicated):     Assets:  Communication Skills Housing Resilience Social Support Transportation  ADL's:  Intact  Cognition:  WNL  Sleep:  Number of Hours: 6.75    Treatment Plan Summary: Daily contact with patient to assess and evaluate symptoms and progress in treatment. Plan : Patient is seen and examined. Laura Lambert is a 24 year old female with past psychiatric history of depression, generalized anxiety disorder, borderline personality disorder presented as a voluntary walk in at [facility] with suicidal ideations and a plan to overdose on 03/14/2020. She was sent to  [facility] where she admitted to suicidal ideation over the past few days, worsing, leading to overdose attempt on 03/14/2020@ 16:30.  She took 5 tablets of 50 mg Trazodone @ 16:30  to harm herself.  Diagnosis: 1.  MDD. 2.  Hypothyroidism. 3.  Pregnant. 4.  Adjustment disorder 5.  Generalized anxiety disorder 6.  Delta 8 and vape dependence  7. Borderline personality disorder  Pertinent findings on examination today: 1.  Shows improved mood 2.  No suicidal ideations. 3.  Good sleep and appetite 4. Anxious about family meeting.  Plan:  Scheduled medications:  1. Zoloft 125 mg. 2. Prenatal vitamins 1 tablet.  PRN's :  3. Tylenol 650 mg for mild pain 4. Maalox 30 ml for indigestion 5.Benadryl 25 mg for itching, allergies. 6.Benadryl 50 mg for insomnia. 7. Milk of magnesia 30 ml for mild constipation.   Psychosocial: 8. Encourgement to attend group therapies. 9. Appreciate the social workers help with clarifying home situation. 10. Disposition in progress.    Arnoldo Lenis, MD 03/17/2020, 10:30 AM

## 2020-03-18 MED ORDER — SERTRALINE HCL 25 MG PO TABS
125.0000 mg | ORAL_TABLET | Freq: Every day | ORAL | 0 refills | Status: DC
Start: 2020-03-18 — End: 2023-10-02

## 2020-03-18 MED ORDER — PRENATAL MULTIVITAMIN CH
1.0000 | ORAL_TABLET | Freq: Every day | ORAL | 0 refills | Status: AC
Start: 2020-03-18 — End: ?

## 2020-03-18 MED ORDER — DIPHENHYDRAMINE HCL 25 MG PO CAPS
25.0000 mg | ORAL_CAPSULE | Freq: Four times a day (QID) | ORAL | 0 refills | Status: DC | PRN
Start: 2020-03-18 — End: 2023-10-02

## 2020-03-18 MED ORDER — DIPHENHYDRAMINE HCL 50 MG PO CAPS
50.0000 mg | ORAL_CAPSULE | Freq: Every evening | ORAL | 0 refills | Status: DC | PRN
Start: 2020-03-18 — End: 2023-10-02
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
## 2020-03-18 NOTE — Progress Notes (Signed)
  [facility] Adult Case Management Discharge Plan :  Will you be returning to the same living situation after discharge:  Yes,  parents' home. At discharge, do you have transportation home?:Yes, father will provide.  Do you have the ability to pay for your medications: Yes,  Pt is insured.  Release of information consent forms completed and in the chart;  Patient's signature needed at discharge.  Patient to Follow up at:  Follow-up Information    [facility]. Go on 03/23/2020.   Why: You have a telehealth appointment with your therapist on Wednesday, September 29th at [facility] information: [address] [place] [address] [phone]        [facility]. Go on 04/07/2020.   Why: You have a telehealth appointment with Dr. Aileen Pilot on October 14th at [facility] information: [address] [place], [place] [address]  Phone: [phone] Fax: [phone]              Next level of care provider has access to [facility] Link:no  Safety Planning and Suicide Prevention discussed: Yes,  completed with Pt and parents on 03/17/2020  Have you used any form of tobacco in the last 30 days? (Cigarettes, Smokeless Tobacco, Cigars, and/or Pipes): No  Has patient been referred to the Quitline?: N/A patient is not a smoker  Patient has been referred for addiction treatment: N/A  Jacinta Shoe, LCSW 03/18/2020, 11:57 AM

## 2020-03-18 NOTE — Discharge Instructions (Signed)
1. US OB vaginal in next 10-14 days. 2. Repeat TSH, free T3, T 4 in a month.

## 2020-03-18 NOTE — Progress Notes (Signed)
  [facility] Adult Case Management Discharge Plan :  Will you be returning to the same living situation after discharge:  No. Returning home with parents.  At discharge, do you have transportation home?: Yes,  Parents  Do you have the ability to pay for your medications: Yes,  Private insurance   Release of information consent forms completed and in the chart;  Patient's signature needed at discharge.  Patient to Follow up at:  Follow-up Information    [facility]. Go on 03/23/2020.   Why: You have a telehealth appointment with your therapist on Wednesday, September 29th at [facility] information: [address] [place] [address] [phone]        [facility]. Go on 04/07/2020.   Why: You have a telehealth appointment with Dr. Aileen Pilot on October 14th at [facility] information: [address] [place], [place] [address]  Phone: [phone] Fax: [phone]              Next level of care provider has access to [facility] Link:yes  Safety Planning and Suicide Prevention discussed: Yes,  with parents   Have you used any form of tobacco in the last 30 days? (Cigarettes, Smokeless Tobacco, Cigars, and/or Pipes): No  Has patient been referred to the Quitline?: Patient refused referral  Patient has been referred for addiction treatment: N/A  Aram Beecham, LCSWA 03/18/2020, 10:44 AM

## 2020-03-18 NOTE — Progress Notes (Signed)
Recreation Therapy Notes  Date:  9.24.21 Time: 0930 Location: [address] Dayroom  Group Topic: Stress Management  Goal Area(s) Addresses:  Patient will identify positive stress management techniques. Patient will identify benefits of using stress management post d/c.  Behavioral Response: Entgaged  Intervention: Stress Management  Activity: Guided Imagery.  LRT read a script that focused on going anywhere you envision by floating on a cloud.  Patients were to listen and follow along as script was read to engage in activity.    Education:  Stress Management, Discharge Planning.   Education Outcome: Acknowledges Education  Clinical Observations/Feedback:  Pt attended and participated in group.    Caroll Rancher, LRT/CTRS         Caroll Rancher A 03/18/2020 10:19 AM

## 2020-03-18 NOTE — Progress Notes (Signed)
   03/17/20 2120  Psych Admission Type (Psych Patients Only)  Admission Status Voluntary  Psychosocial Assessment  Patient Complaints None  Eye Contact Fair  Facial Expression Sad;Anxious  Affect Apprehensive;Depressed;Sad;Anxious  Speech Logical/coherent  Interaction Assertive  Motor Activity Other (Comment) (wnl)  Appearance/Hygiene Unremarkable  Behavior Characteristics Cooperative  Mood Depressed  Thought Process  Coherency WDL  Content WDL  Delusions None reported or observed  Perception WDL  Hallucination None reported or observed  Judgment Poor  Confusion None  Danger to Self  Current suicidal ideation? Denies  Danger to Others  Danger to Others None reported or observed   Pt went to [facility] for transvaginal ultrasound today. Pt states that the procedure went well. Pt asked about her feelings now that the pregnancy is confirmed. Pt states that she is fine. She believes that she will probably give the child up for adoption. Pt will reside with her parents at discharge.

## 2020-03-18 NOTE — Progress Notes (Signed)
Pt discharged to lobby. Pt was stable and appreciative at that time. All papers and prescriptions were given and valuables returned. Verbal understanding expressed. Denies SI/HI and A/VH. Pt given opportunity to express concerns and ask questions.  

## 2020-03-28 DIAGNOSIS — F603 Borderline personality disorder: Secondary | ICD-10-CM | POA: Diagnosis not present

## 2020-04-05 DIAGNOSIS — F603 Borderline personality disorder: Secondary | ICD-10-CM | POA: Diagnosis not present

## 2020-04-15 DIAGNOSIS — F603 Borderline personality disorder: Secondary | ICD-10-CM | POA: Diagnosis not present

## 2020-04-20 DIAGNOSIS — Z3687 Encounter for antenatal screening for uncertain dates: Secondary | ICD-10-CM | POA: Diagnosis not present

## 2020-04-20 DIAGNOSIS — Z3A10 10 weeks gestation of pregnancy: Secondary | ICD-10-CM | POA: Diagnosis not present

## 2020-04-20 DIAGNOSIS — N911 Secondary amenorrhea: Secondary | ICD-10-CM | POA: Diagnosis not present

## 2020-04-20 DIAGNOSIS — Z23 Encounter for immunization: Secondary | ICD-10-CM | POA: Diagnosis not present

## 2020-04-20 DIAGNOSIS — Z3201 Encounter for pregnancy test, result positive: Secondary | ICD-10-CM | POA: Diagnosis not present

## 2020-04-21 DIAGNOSIS — F603 Borderline personality disorder: Secondary | ICD-10-CM | POA: Diagnosis not present

## 2020-04-25 DIAGNOSIS — F418 Other specified anxiety disorders: Secondary | ICD-10-CM | POA: Diagnosis not present

## 2020-04-25 DIAGNOSIS — Z3A11 11 weeks gestation of pregnancy: Secondary | ICD-10-CM | POA: Diagnosis not present

## 2020-04-25 DIAGNOSIS — F99 Mental disorder, not otherwise specified: Secondary | ICD-10-CM | POA: Diagnosis not present

## 2020-04-25 DIAGNOSIS — O99341 Other mental disorders complicating pregnancy, first trimester: Secondary | ICD-10-CM | POA: Diagnosis not present

## 2020-04-25 DIAGNOSIS — Z3689 Encounter for other specified antenatal screening: Secondary | ICD-10-CM | POA: Diagnosis not present

## 2020-04-27 DIAGNOSIS — F29 Unspecified psychosis not due to a substance or known physiological condition: Secondary | ICD-10-CM | POA: Diagnosis not present

## 2020-05-23 DIAGNOSIS — F603 Borderline personality disorder: Secondary | ICD-10-CM | POA: Diagnosis not present

## 2020-06-02 DIAGNOSIS — F603 Borderline personality disorder: Secondary | ICD-10-CM | POA: Diagnosis not present

## 2020-06-14 DIAGNOSIS — F29 Unspecified psychosis not due to a substance or known physiological condition: Secondary | ICD-10-CM | POA: Diagnosis not present

## 2020-06-21 DIAGNOSIS — F603 Borderline personality disorder: Secondary | ICD-10-CM | POA: Diagnosis not present

## 2020-06-24 DIAGNOSIS — Z363 Encounter for antenatal screening for malformations: Secondary | ICD-10-CM | POA: Diagnosis not present

## 2020-06-24 DIAGNOSIS — Z3A20 20 weeks gestation of pregnancy: Secondary | ICD-10-CM | POA: Diagnosis not present

## 2020-06-28 DIAGNOSIS — F29 Unspecified psychosis not due to a substance or known physiological condition: Secondary | ICD-10-CM | POA: Diagnosis not present

## 2020-07-07 DIAGNOSIS — F603 Borderline personality disorder: Secondary | ICD-10-CM | POA: Diagnosis not present

## 2020-07-13 DIAGNOSIS — F29 Unspecified psychosis not due to a substance or known physiological condition: Secondary | ICD-10-CM | POA: Diagnosis not present

## 2021-07-15 IMAGING — US US OB TRANSVAGINAL
1 series · 14 of 28 positions shown · non-contrast
Comparison: None.

CLINICAL DATA: Question of early gestation

EXAM:
TRANSVAGINAL OB ULTRASOUND
TECHNIQUE: Transvaginal ultrasound was performed for complete evaluation of the
gestation as well as the maternal uterus, adnexal regions, and
pelvic cul-de-sac.

[Series 1: us ob transvaginal · 14 of 50 slices shown]
[im 2/50]
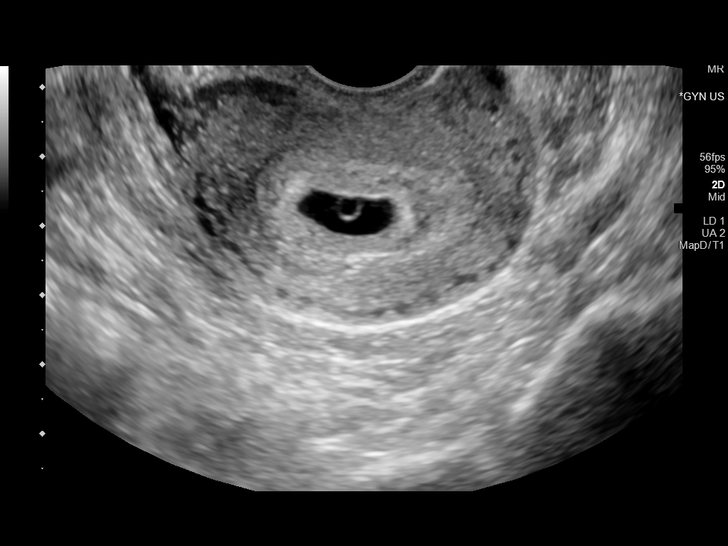
[im 6/50]
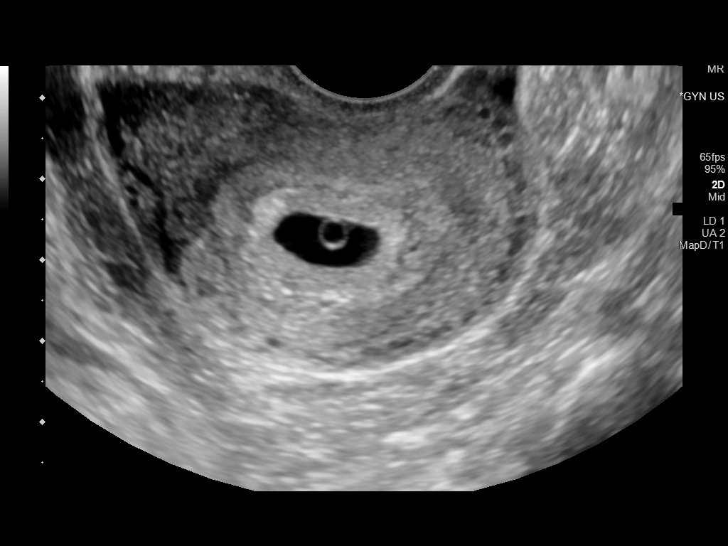
[im 10/50]
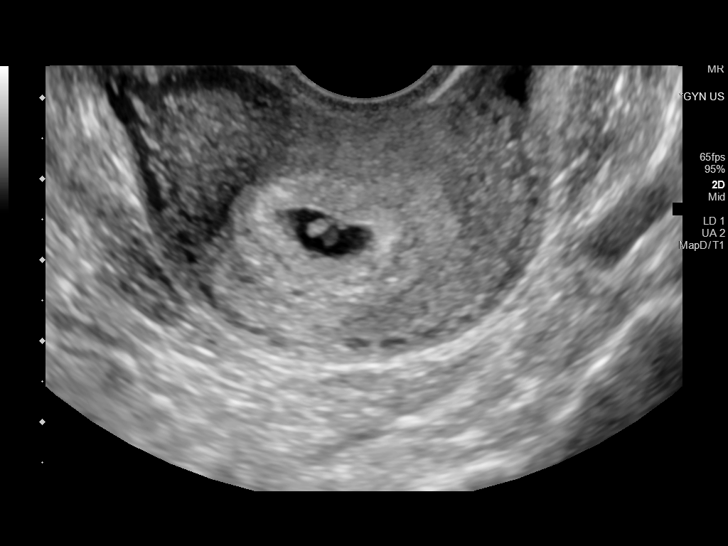
[im 13/50]
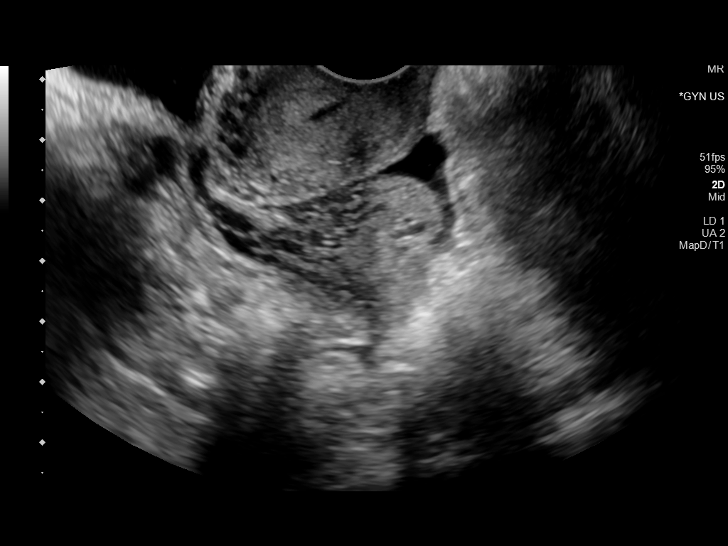
[im 17/50]
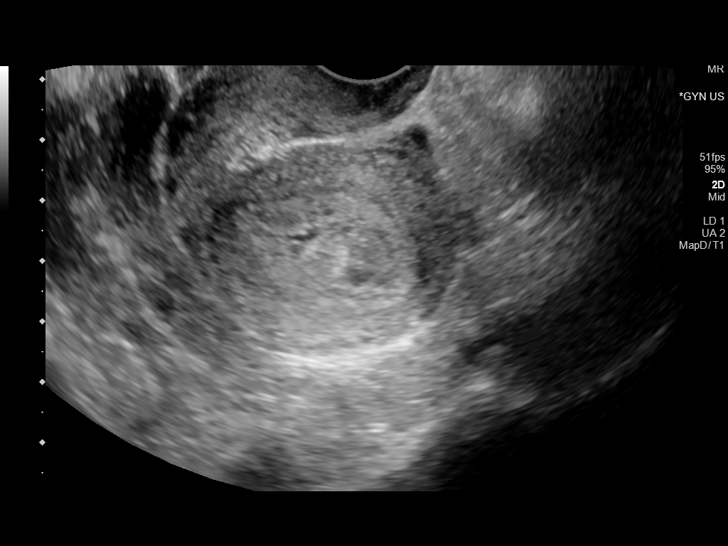
[im 20/50]
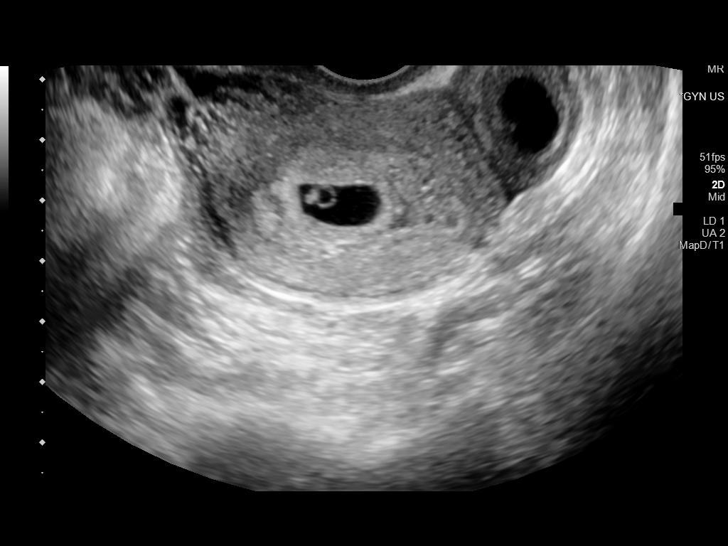
[im 24/50]
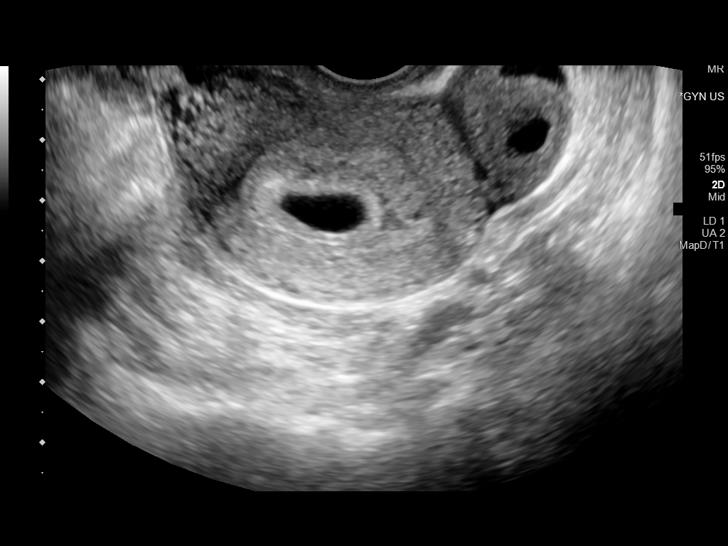
[im 28/50]
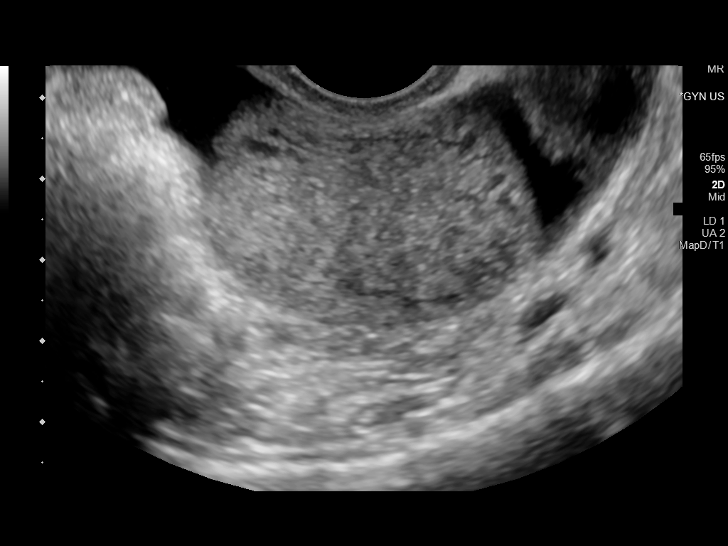
[im 31/50]
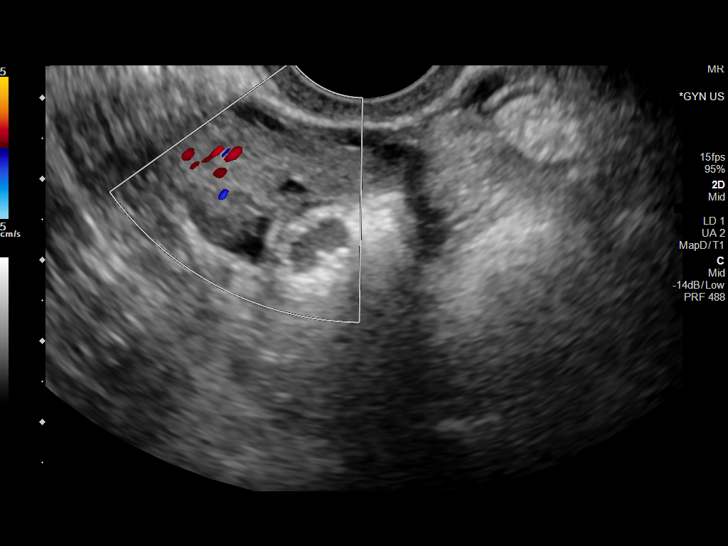
[im 35/50]
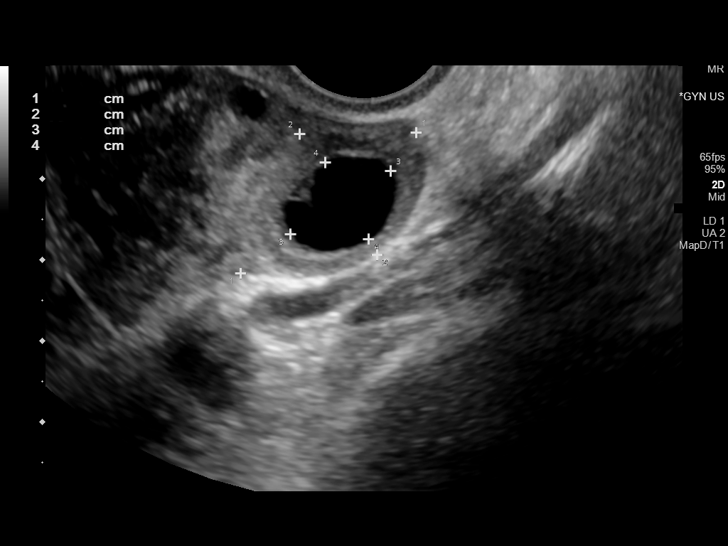
[im 39/50]
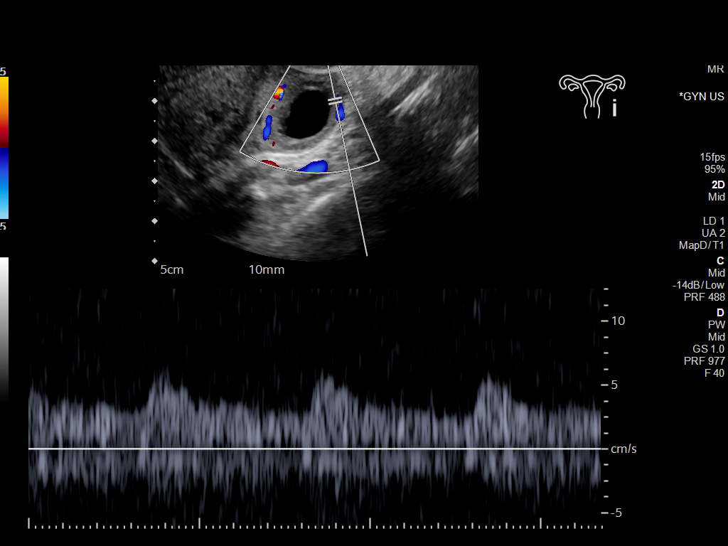
[im 42/50]
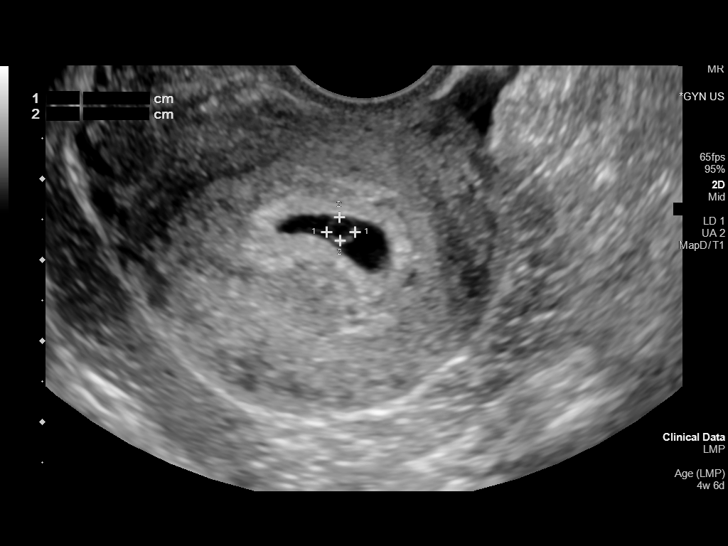
[im 46/50]
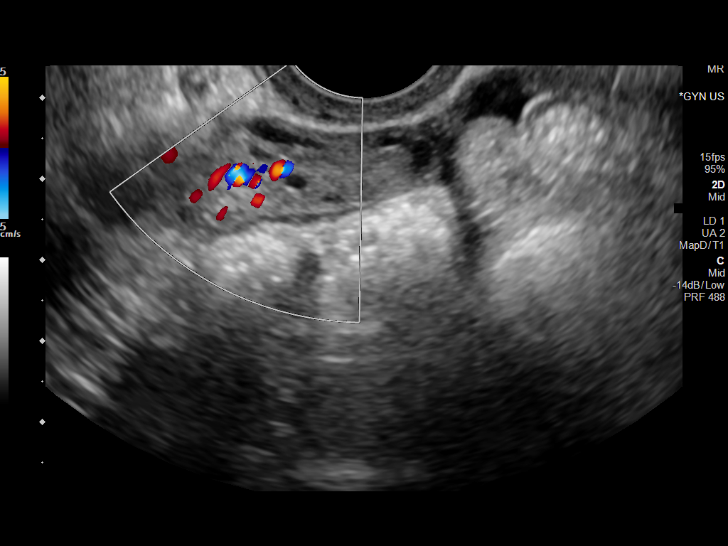
[im 50/50]
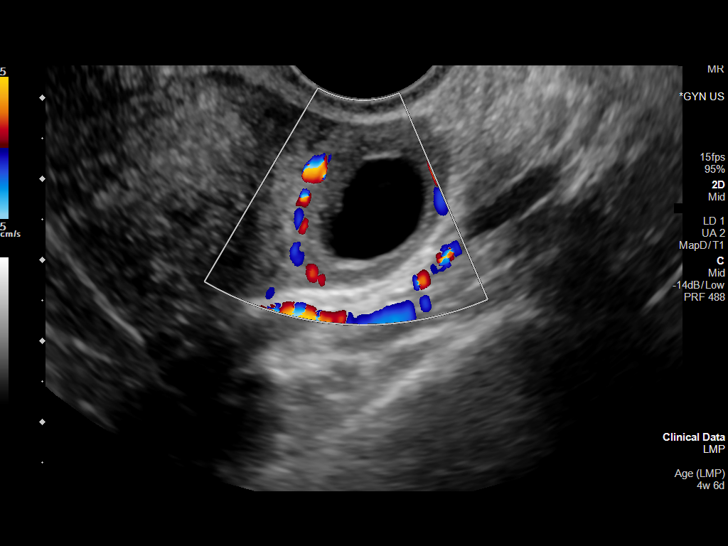

[14 of 28 positions shown; findings below may reference images not displayed]

FINDINGS: Intrauterine gestational sac: Visualized-single

Yolk sac:  Visualized

Embryo:  Not visualized

Cardiac Activity: Not visualized

MSD: 12 mm   6 w   0 d

Subchorionic hemorrhage:  None visualized.

Maternal uterus/adnexae: Cervical os closed. Right ovary measures
2.7 x 1.7 x 2.1 cm. Left ovary measures 2.8 x 1.8 x 2.0 cm. Probable
corpus luteum in left ovary measuring 1.5 x 1.1 x 1.1 cm. No other
extrauterine pelvic mass. Slight fluid noted in the dependent
portion of the pelvis.
IMPRESSION: Gestational sac containing yolk sac seen. Fetal pole currently not
seen. This finding warrants a follow-up study in 10-14 days to
assess for fetal pole and fetal heart activity. No subchorionic
hemorrhage.

Probable small corpus luteum left ovary. Small amount of free fluid
could indicate recent ovarian cyst/corpus luteum leakage.

## 2023-10-02 ENCOUNTER — Emergency Department (HOSPITAL_COMMUNITY)
Admission: AD | Admit: 2023-10-02 | Discharge: 2023-10-03 | Disposition: A | Discharge status: Other Acute Inpatient Hospital | Attending: Obstetrics | Admitting: Obstetrics

## 2023-10-02 ENCOUNTER — Other Ambulatory Visit: Payer: Self-pay

## 2023-10-02 ENCOUNTER — Encounter (HOSPITAL_COMMUNITY): Payer: Self-pay

## 2023-10-02 DIAGNOSIS — F99 Mental disorder, not otherwise specified: Secondary | ICD-10-CM

## 2023-10-02 DIAGNOSIS — F29 Unspecified psychosis not due to a substance or known physiological condition: Secondary | ICD-10-CM

## 2023-10-02 DIAGNOSIS — G4700 Insomnia, unspecified: Secondary | ICD-10-CM | POA: Diagnosis present

## 2023-10-02 DIAGNOSIS — F332 Major depressive disorder, recurrent severe without psychotic features: Secondary | ICD-10-CM | POA: Insufficient documentation

## 2023-10-02 DIAGNOSIS — O26892 Other specified pregnancy related conditions, second trimester: Secondary | ICD-10-CM | POA: Diagnosis present

## 2023-10-02 DIAGNOSIS — F23 Brief psychotic disorder: Secondary | ICD-10-CM | POA: Insufficient documentation

## 2023-10-02 DIAGNOSIS — O99342 Other mental disorders complicating pregnancy, second trimester: Secondary | ICD-10-CM | POA: Insufficient documentation

## 2023-10-02 DIAGNOSIS — F429 Obsessive-compulsive disorder, unspecified: Secondary | ICD-10-CM | POA: Diagnosis present

## 2023-10-02 DIAGNOSIS — F5105 Insomnia due to other mental disorder: Secondary | ICD-10-CM

## 2023-10-02 DIAGNOSIS — F4010 Social phobia, unspecified: Secondary | ICD-10-CM | POA: Insufficient documentation

## 2023-10-02 DIAGNOSIS — F603 Borderline personality disorder: Secondary | ICD-10-CM | POA: Insufficient documentation

## 2023-10-02 DIAGNOSIS — F411 Generalized anxiety disorder: Secondary | ICD-10-CM | POA: Insufficient documentation

## 2023-10-02 DIAGNOSIS — E876 Hypokalemia: Secondary | ICD-10-CM

## 2023-10-02 DIAGNOSIS — Z3490 Encounter for supervision of normal pregnancy, unspecified, unspecified trimester: Secondary | ICD-10-CM

## 2023-10-02 DIAGNOSIS — Z3A17 17 weeks gestation of pregnancy: Secondary | ICD-10-CM | POA: Diagnosis not present

## 2023-10-02 DIAGNOSIS — F422 Mixed obsessional thoughts and acts: Secondary | ICD-10-CM

## 2023-10-02 LAB — RAPID URINE DRUG SCREEN, HOSP PERFORMED
Amphetamines: NOT DETECTED
Barbiturates: NOT DETECTED
Benzodiazepines: POSITIVE — AB
Cocaine: NOT DETECTED
Opiates: NOT DETECTED
Tetrahydrocannabinol: POSITIVE — AB

## 2023-10-02 LAB — CBC WITH DIFFERENTIAL/PLATELET
Abs Immature Granulocytes: 0.05 10*3/uL (ref 0.00–0.07)
Basophils Absolute: 0 10*3/uL (ref 0.0–0.1)
Basophils Relative: 0 %
Eosinophils Absolute: 0 10*3/uL (ref 0.0–0.5)
Eosinophils Relative: 0 %
HCT: 31.3 % — ABNORMAL LOW (ref 36.0–46.0)
Hemoglobin: 11 g/dL — ABNORMAL LOW (ref 12.0–15.0)
Immature Granulocytes: 0 %
Lymphocytes Relative: 10 %
Lymphs Abs: 1.4 10*3/uL (ref 0.7–4.0)
MCH: 31.3 pg (ref 26.0–34.0)
MCHC: 35.1 g/dL (ref 30.0–36.0)
MCV: 89.2 fL (ref 80.0–100.0)
Monocytes Absolute: 0.7 10*3/uL (ref 0.1–1.0)
Monocytes Relative: 5 %
Neutro Abs: 11.6 10*3/uL — ABNORMAL HIGH (ref 1.7–7.7)
Neutrophils Relative %: 85 %
Platelets: 240 10*3/uL (ref 150–400)
RBC: 3.51 MIL/uL — ABNORMAL LOW (ref 3.87–5.11)
RDW: 12.3 % (ref 11.5–15.5)
WBC: 13.8 10*3/uL — ABNORMAL HIGH (ref 4.0–10.5)
nRBC: 0 % (ref 0.0–0.2)

## 2023-10-02 LAB — URINALYSIS, ROUTINE W REFLEX MICROSCOPIC
Bilirubin Urine: NEGATIVE
Glucose, UA: NEGATIVE mg/dL
Hgb urine dipstick: NEGATIVE
Ketones, ur: 20 mg/dL — AB
Leukocytes,Ua: NEGATIVE
Nitrite: NEGATIVE
Protein, ur: NEGATIVE mg/dL
Specific Gravity, Urine: 1.005 (ref 1.005–1.030)
pH: 6 (ref 5.0–8.0)

## 2023-10-02 LAB — COMPREHENSIVE METABOLIC PANEL WITH GFR
ALT: 26 U/L (ref 0–44)
AST: 24 U/L (ref 15–41)
Albumin: 3.2 g/dL — ABNORMAL LOW (ref 3.5–5.0)
Alkaline Phosphatase: 49 U/L (ref 38–126)
Anion gap: 12 (ref 5–15)
BUN: 7 mg/dL (ref 6–20)
CO2: 22 mmol/L (ref 22–32)
Calcium: 8.6 mg/dL — ABNORMAL LOW (ref 8.9–10.3)
Chloride: 98 mmol/L (ref 98–111)
Creatinine, Ser: 0.57 mg/dL (ref 0.44–1.00)
GFR, Estimated: >60 mL/min (ref >60)
Glucose, Bld: 110 mg/dL — ABNORMAL HIGH (ref 70–99)
Potassium: 2.9 mmol/L — ABNORMAL LOW (ref 3.5–5.1)
Sodium: 132 mmol/L — ABNORMAL LOW (ref 135–145)
Total Bilirubin: 1 mg/dL (ref 0.0–1.2)
Total Protein: 6.4 g/dL — ABNORMAL LOW (ref 6.5–8.1)

## 2023-10-02 LAB — ETHANOL: Alcohol, Ethyl (B): <10 mg/dL (ref <10)

## 2023-10-02 MED ORDER — COMPLETENATE 29-1 MG PO CHEW
1.0000 | CHEWABLE_TABLET | Freq: Every day | ORAL | Status: DC
  Administered 2023-10-02 – 2023-10-03 (×2): 1 via ORAL
  Filled 2023-10-02 (×3): qty 1

## 2023-10-02 MED ORDER — NICOTINE 21 MG/24HR TD PT24
21.0000 mg | MEDICATED_PATCH | Freq: Every day | TRANSDERMAL | Status: DC
  Filled 2023-10-02: qty 1

## 2023-10-02 MED ORDER — ACETAMINOPHEN 325 MG PO TABS: 650.0000 mg | ORAL_TABLET | ORAL | Status: DC | PRN

## 2023-10-02 MED ORDER — ALUM & MAG HYDROXIDE-SIMETH 200-200-20 MG/5ML PO SUSP: 30.0000 mL | Freq: Four times a day (QID) | ORAL | Status: DC | PRN

## 2023-10-02 MED ORDER — HALOPERIDOL LACTATE 5 MG/ML IJ SOLN
5.0000 mg | Freq: Once | INTRAMUSCULAR | Status: AC
  Administered 2023-10-02: 5 mg via INTRAMUSCULAR
  Filled 2023-10-02: qty 1

## 2023-10-02 MED ORDER — ARIPIPRAZOLE 5 MG PO TABS
15.0000 mg | ORAL_TABLET | Freq: Every day | ORAL | Status: DC
  Administered 2023-10-02 – 2023-10-03 (×2): 15 mg via ORAL
  Filled 2023-10-02 (×2): qty 1

## 2023-10-02 MED ORDER — POTASSIUM CHLORIDE CRYS ER 20 MEQ PO TBCR
40.0000 meq | EXTENDED_RELEASE_TABLET | Freq: Once | ORAL | Status: AC
  Administered 2023-10-02: 40 meq via ORAL
  Filled 2023-10-02: qty 2

## 2023-10-02 MED ORDER — ONDANSETRON HCL 4 MG PO TABS
4.0000 mg | ORAL_TABLET | Freq: Three times a day (TID) | ORAL | Status: DC | PRN
  Administered 2023-10-03: 4 mg via ORAL
  Filled 2023-10-02: qty 1

## 2023-10-02 MED ORDER — LORAZEPAM 2 MG/ML IJ SOLN
2.0000 mg | Freq: Once | INTRAMUSCULAR | Status: AC
  Administered 2023-10-02: 2 mg via INTRAMUSCULAR
  Filled 2023-10-02: qty 1

## 2023-10-02 NOTE — ED Notes (Signed)
 IVC'd 10/02/2023, exp 10/09/23 NOTE: Custody Order, which was served at 7:00am today, still needs to be e-filed.  System not recognizing case no./name of patient yet (as of 9:30am) thus disabling filing of the Custody Order. Advised to try throughout the day by [employer].

## 2023-10-02 NOTE — ED Notes (Signed)
 Pt not wanting to dress out at this time. Pt walking back and forth stating "I'm confused what's happening". PA notified,

## 2023-10-02 NOTE — ED Notes (Signed)
 Pt is taking a shower.

## 2023-10-02 NOTE — ED Notes (Signed)
 E-filing of Custody Order completed; IVC # [id number]. Docs in [place].

## 2023-10-02 NOTE — MAU Note (Signed)
.  Laura Lambert is a 28 y.o. at [redacted]w[redacted]d here in MAU reporting not eating or sleeping for a week. Pt became teary eyed in Triage and crying but then suddenly stopped. Denies VB. When asked why she had not eaten or slept she reported having hyperemesis due to cannibus use. She did state she was able to keep down small amts of food and liquid. Takes promethazine which is helping. No pain  LMP: n/a Onset of complaint: one week Pain score: 0 Vitals:   10/02/23 0251 10/02/23 0253  BP:  131/76  Pulse: 99   Resp: 18   Temp: 98.6 F (37 C)   SpO2: 99%      FHT: 147  Lab orders placed from triage: u/a

## 2023-10-02 NOTE — BHH Counselor (Signed)
 LCSW Progress Note:   Laura Lambert  MRN: [id number]  10/02/2023 10:39 PM   @10 :27 PM, per Intake Coordinator Cindee Lame), patient accepted to [facility] on 10/03/2023 after 11:00 AM. The accepting provider is Dr. Doreatha Massed. The nurse report number is [phone]. The patient's care team has been notified of the disposition updates.
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
## 2023-10-02 NOTE — ED Notes (Addendum)
 RN called to bed side along with security by sitter. Pt told sitter she was about to leave and began to dress her self with sweater. Pt now denies leaving, but questioned RN as to why she is her. PT is confused and oriented to person and situation only. Sitter remains at bedside. Pt denies anxiety but her mannerism is restless with fidgeting.

## 2023-10-02 NOTE — Progress Notes (Signed)
 LCSW Progress Note  [id number]   Laura Lambert  10/02/2023  1:00 PM  Description:   Inpatient Psychiatric Referral  Patient was recommended inpatient per Arsenio Loader NP There are no available beds at [facility], per [facility] Rona Ravens RN. Patient was referred to the following out of network facilities: Destination  Service Provider Address Phone Fax  [facility] [address] [place], [place] [address] [phone]  [facility] [address], [place] [address] [phone]  [facility] [address] [place] [address] [phone]  [facility] [address] [place] [address] [phone]  [facility] [address], [place] [address] [phone]  [facility] EFAX [address] [place], [place] [phone]  [facility] [address] [place] [address] [phone]  [facility] [address], [place] [address] [phone]      Situation ongoing, CSW to continue following and update chart as more information becomes available.      [name], MSW, LCSW  10/02/2023 1:00 PM

## 2023-10-02 NOTE — ED Notes (Addendum)
 Pt was highly agitated asking if "can somone use records to find out if my daughter is dead", now she's repating the phrase "is she dead" over and over and trying to elope.  PA  bowie contact. Pt given 2mg  of ativan IM. Safety of ativan confirmed with pharmarcist rachel due to pregancy concerns (see note in mar).

## 2023-10-02 NOTE — ED Notes (Signed)
 Pt wanded by security.

## 2023-10-02 NOTE — ED Notes (Signed)
 IVC docs taken to [place]

## 2023-10-02 NOTE — ED Notes (Signed)
 Report given to receiving rn. PT is being walked with sitter and police (hernadez) to [address] in purple.

## 2023-10-02 NOTE — ED Notes (Signed)
 Pt tried to run and again was notified she is not able to leave at this time. Security and [employer] at bedside. Pt back in bed at this time.

## 2023-10-02 NOTE — ED Provider Notes (Signed)
 [place] EMERGENCY DEPARTMENT AT [facility] Provider Note   CSN: [id number] Arrival date & time: 10/02/23  9629     History  Chief Complaint  Patient presents with   Medical Clearance    Laura Lambert is a 28 y.o. female.  The history is provided by the patient and medical records.   28 year old female with history of borderline personality disorder, anxiety, insomnia, depression, presenting to the ED from MAU for psychiatric evaluation.  Apparently presented there to check on her baby.  She was observed to have bizarre behavior with provider.  She did have ultrasound which was reassuring and when given this news she accused the provider of lying to her.  She tells me and nurse that "that was not my baby on the screen, she was making that up."  She states she feels like no one is helping her.  She is reading bible intermittently.  She states she has not slept in about 5 days.  She is minimally eating-- applesauce packets and pieces of bagels.  Admits to some marijuana  use, denies other drug use.  She is only taking promethazine currently.  No voiced SI/HI at MAU or here in ED.  Home Medications Prior to Admission medications   Medication Sig Start Date End Date Taking? Authorizing Provider  Prenatal Vit-Fe Fumarate-FA (PRENATAL MULTIVITAMIN) TABS tablet Take 1 tablet by mouth daily at 12 noon. 03/18/20  Yes Dagar, Geralynn Rile, MD  promethazine (PHENERGAN) 25 MG tablet Take 25 mg by mouth every 6 (six) hours as needed for nausea or vomiting.   Yes [provider]  diphenhydrAMINE (BENADRYL) 25 mg capsule Take 1 capsule (25 mg total) by mouth every 6 (six) hours as needed for itching or allergies. Patient not taking: Reported on 10/02/2023 03/18/20   Dagar, Geralynn Rile, MD  diphenhydrAMINE (BENADRYL) 50 MG capsule Take 1 capsule (50 mg total) by mouth at bedtime as needed (insomnia). Patient not taking: Reported on 10/02/2023 03/18/20 04/17/20  Dagar, Geralynn Rile, MD  sertraline  (ZOLOFT) 25 MG tablet Take 5 tablets (125 mg total) by mouth daily. Patient not taking: Reported on 10/02/2023 03/18/20   Dagar, Geralynn Rile, MD      Allergies    Latex    Review of Systems   Review of Systems  Psychiatric/Behavioral:  Positive for agitation. The patient is nervous/anxious.   All other systems reviewed and are negative.   Physical Exam Updated Vital Signs BP (!) 136/110 (BP Location: Right Arm)   Pulse (!) 104   Temp 98.6 F (37 C) (Oral)   Resp (!) 22   Ht 5\' 6"  (1.676 m)   Wt 71.7 kg   SpO2 100%   BMI 25.50 kg/m   Physical Exam Vitals and nursing note reviewed.  Constitutional:      Appearance: She is well-developed.  HENT:     Head: Normocephalic and atraumatic.  Eyes:     Conjunctiva/sclera: Conjunctivae normal.     Pupils: Pupils are equal, round, and reactive to light.  Cardiovascular:     Rate and Rhythm: Normal rate and regular rhythm.     Heart sounds: Normal heart sounds.  Pulmonary:     Effort: Pulmonary effort is normal.     Breath sounds: Normal breath sounds.  Abdominal:     General: Bowel sounds are normal.     Palpations: Abdomen is soft.  Musculoskeletal:        General: Normal range of motion.     Cervical back: Normal range  of motion.  Skin:    General: Skin is warm and dry.  Neurological:     Mental Status: She is alert and oriented to person, place, and time.  Psychiatric:     Comments: Anxiuos appearing, seems quite paranoid-- looking around multiple times before speaking, checking underneath blanket, etc; pausing between answering questions (? Internal stimuli)     ED Results / Procedures / Treatments   Labs (all labs ordered are listed, but only abnormal results are displayed) Labs Reviewed  URINALYSIS, ROUTINE W REFLEX MICROSCOPIC  CBC WITH DIFFERENTIAL/PLATELET  COMPREHENSIVE METABOLIC PANEL WITH GFR  ETHANOL  RAPID URINE DRUG SCREEN, HOSP PERFORMED    EKG None  Radiology No results  found.  Procedures Procedures    CRITICAL CARE Performed by: Garlon Hatchet   Total critical care time: 45 minutes  Critical care time was exclusive of separately billable procedures and treating other patients.  Critical care was necessary to treat or prevent imminent or life-threatening deterioration.  Critical care was time spent personally by me on the following activities: development of treatment plan with patient and/or surrogate as well as nursing, discussions with consultants, evaluation of patient's response to treatment, examination of patient, obtaining history from patient or surrogate, ordering and performing treatments and interventions, ordering and review of laboratory studies, ordering and review of radiographic studies, pulse oximetry and re-evaluation of patient's condition.   Medications Ordered in ED Medications - No data to display  ED Course/ Medical Decision Making/ A&P                                 Medical Decision Making Amount and/or Complexity of Data Reviewed Labs: ordered.  Risk Prescription drug management.   28 y.o. F sent over for MAU for pyshciatric evaluation.  Had bizarre behavior there, hyper religious thoughts, etc.  Admits she has not slept in several days.    She is awake, alert.  She is quite paranoid on my evaluation--keeps looking around for speaking, looking under her blanket, etc.  She is not making a lot of sense otherwise.  No voiced SI/HI.  5:33 AM Patient behaving more erratically here.  She does not seem to have any insight into her behaviors or why she is in the ED.  Keeps insisting that we need to "check her baby."  When informed this was just done with OB she insists "that was not my baby".  She is not complying with attempted blood draw, changing clothes, etc.  She is now up and pacing the halls.  Attempting to re-direct back to stretcher. Unfortunately, does not appear safe for discharge at this time.  IVC has been  filed.  6:18 AM Patient attempted to leave ED x2.  Brought back to stretcher with multiple [employer] officers and security.  Attempted to explain to patient why she is still here in the ED, she again does not seem to have any insight.  She has picked up her bible and began reciting again.  Initially was calm but then began kicking and swatting at officers.  Best efforts made to avoid sedating medications given her pregnancy but patient is not re-directable at this time and is becoming a threat to herself and staff.  She is given haldol and ativan.  Still awaiting lab draw.  Care signed out to oncoming provider.  Will follow-up on labs and get TTS consult.  Final Clinical Impression(s) / ED Diagnoses Final  diagnoses:  Acute psychosis [facility])    Rx / DC Orders ED Discharge Orders     None         Garlon Hatchet, PA-C 10/02/23 0643    Melene Plan, DO 10/02/23 [phone]

## 2023-10-02 NOTE — ED Triage Notes (Signed)
 Pt here from MAU with concerns for her baby. Pt is [redacted] weeks pregnant,already cleared by MAU. Pt sent for medical clearance as she was crying then suddenly stopped. Expressed concerns for psychotic behavior. Pt has had decrease PO intake and had not been sleeping

## 2023-10-02 NOTE — ED Provider Notes (Addendum)
  Received signout from this provider, please see her note for complete H&P.  28 year old female who is approximately [redacted] weeks pregnant presenting with symptoms concerning for psychosis.  Patient was evaluated medically and found to be hypokalemic, potassium supplementation given.  IVC paperwork filed.  [facility] was consulted and psychiatry has evaluated patient and recommend inpatient psychiatric management for her condition.  ED ECG REPORT   Date: 10/02/2023  Rate: 68  Rhythm: normal sinus rhythm and sinus arrhythmia  QRS Axis: normal  Intervals: normal  ST/T Wave abnormalities: normal  Conduction Disutrbances:none  Narrative Interpretation:   Old EKG Reviewed: none available  I have personally reviewed the EKG tracing and agree with the computerized printout as noted.   BP (!) 136/110 (BP Location: Right Arm)   Pulse (!) 104   Temp 98.6 F (37 C) (Oral)   Resp (!) 22   Ht 5\' 6"  (1.676 m)   Wt 71.7 kg   SpO2 100%   BMI 25.50 kg/m   Results for orders placed or performed during the hospital encounter of 10/02/23  CBC with Differential   Collection Time: 10/02/23  7:00 AM  Result Value Ref Range   WBC 13.8 (H) 4.0 - 10.5 K/uL   RBC 3.51 (L) 3.87 - 5.11 MIL/uL   Hemoglobin 11.0 (L) 12.0 - 15.0 g/dL   HCT 86.5 (L) 78.4 - 69.6 %   MCV 89.2 80.0 - 100.0 fL   MCH 31.3 26.0 - 34.0 pg   MCHC 35.1 30.0 - 36.0 g/dL   RDW 29.5 28.4 - 13.2 %   Platelets 240 150 - 400 K/uL   nRBC 0.0 0.0 - 0.2 %   Neutrophils Relative % 85 %   Neutro Abs 11.6 (H) 1.7 - 7.7 K/uL   Lymphocytes Relative 10 %   Lymphs Abs 1.4 0.7 - 4.0 K/uL   Monocytes Relative 5 %   Monocytes Absolute 0.7 0.1 - 1.0 K/uL   Eosinophils Relative 0 %   Eosinophils Absolute 0.0 0.0 - 0.5 K/uL   Basophils Relative 0 %   Basophils Absolute 0.0 0.0 - 0.1 K/uL   Immature Granulocytes 0 %   Abs Immature Granulocytes 0.05 0.00 - 0.07 K/uL  Comprehensive metabolic panel   Collection Time: 10/02/23  7:00 AM  Result Value Ref  Range   Sodium 132 (L) 135 - 145 mmol/L   Potassium 2.9 (L) 3.5 - 5.1 mmol/L   Chloride 98 98 - 111 mmol/L   CO2 22 22 - 32 mmol/L   Glucose, Bld 110 (H) 70 - 99 mg/dL   BUN 7 6 - 20 mg/dL   Creatinine, Ser 4.40 0.44 - 1.00 mg/dL   Calcium 8.6 (L) 8.9 - 10.3 mg/dL   Total Protein 6.4 (L) 6.5 - 8.1 g/dL   Albumin 3.2 (L) 3.5 - 5.0 g/dL   AST 24 15 - 41 U/L   ALT 26 0 - 44 U/L   Alkaline Phosphatase 49 38 - 126 U/L   Total Bilirubin 1.0 0.0 - 1.2 mg/dL   GFR, Estimated >10 >27 mL/min   Anion gap 12 5 - 15  Ethanol   Collection Time: 10/02/23  7:00 AM  Result Value Ref Range   Alcohol, Ethyl (B) <10 <10 mg/dL   No results found.    Fayrene Helper, PA-C 10/02/23 1344    Fayrene Helper, PA-C 10/02/23 1409    Gerhard Munch, MD 10/02/23 [phone]

## 2023-10-02 NOTE — ED Notes (Signed)
 Clinician Rebbeca of  [facility] called to inquire about pt status.

## 2023-10-02 NOTE — ED Notes (Signed)
 BELONGS IN LOCKER 1

## 2023-10-02 NOTE — MAU Provider Note (Signed)
 Chief Complaint:  No chief complaint on file.   Event Date/Time   First Provider Initiated Contact with Patient 10/02/23 0259     HPI  HPI: Laura Lambert is a 28 y.o. G3P1010 at 76w0dwho presents to maternity admissions reporting not sleeping or eating in a week.  Tells RN she has cannaboid hyperemesis.   When I went to initiate care, she was sitting in a wheelchair in hallway.  She had been instructed to get a urine sample but never did.  I introduced myself and she said "Laura Lambert, yes Laura Lambert, you know who she is" I asked who that was and stated there was no one at this hospital by that name she laughed and kept saying the name over an over Repeatedly said "can we just get past this?"   Eventually agreed to go into room.  I asked her history questions and she kept insisting I look at her baby and tell her what was wrong with it.  Denies any other symptoms.  I agreed to get [place] machine to assess baby Admits to recent use of marijuana.  RN Note: .Laura Lambert is a 28 y.o. at [redacted]w[redacted]d here in MAU reporting not eating or sleeping for a week. Pt became teary eyed in Triage and crying but then suddenly stopped. Denies VB. When asked why she had not eaten or slept she reported having hyperemesis due to cannibus use. She did state she was able to keep down small amts of food and liquid. Takes promethazine which is helping. No pain  Onset of complaint: one week Pain score: 0  Past Medical History: Past Medical History:  Diagnosis Date   Anxiety    Depression     Past obstetric history: OB History  Gravida Para Term Preterm AB Living  3 1 1  1    SAB IAB Ectopic Multiple Live Births  1        # Outcome Date GA Lbr Len/2nd Weight Sex Type Anes PTL Lv  3 Current           2 Term 11/06/20 [redacted]w[redacted]d    CS-Unspec     1 SAB 2021            Past Surgical History: Past Surgical History:  Procedure Laterality Date   DENTAL SURGERY      Family History: Family History   Problem Relation Age of Onset   Cancer Other    Hyperlipidemia Other    Hypertension Other    Diabetes Other    Heart attack Other    Hypertension Father    Depression Maternal Grandmother     Social History: Social History   Tobacco Use   Smoking status: Never   Smokeless tobacco: Never  Vaping Use   Vaping status: Never Used  Substance Use Topics   Alcohol use: Never    Comment: Occ   Drug use: Never    Allergies: No Known Allergies  Meds:  Medications Prior to Admission  Medication Sig Dispense Refill Last Dose/Taking   diphenhydrAMINE (BENADRYL) 25 mg capsule Take 1 capsule (25 mg total) by mouth every 6 (six) hours as needed for itching or allergies. 30 capsule 0    diphenhydrAMINE (BENADRYL) 50 MG capsule Take 1 capsule (50 mg total) by mouth at bedtime as needed (insomnia). 30 capsule 0    Prenatal Vit-Fe Fumarate-FA (PRENATAL MULTIVITAMIN) TABS tablet Take 1 tablet by mouth daily at 12 noon. 30 tablet 0    sertraline (ZOLOFT) 25  MG tablet Take 5 tablets (125 mg total) by mouth daily. 30 tablet 0     I have reviewed patient's Past Medical Hx, Surgical Hx, Family Hx, Social Hx, medications and allergies.   ROS:  Review of Systems  Unable to perform ROS: Mental status change    Physical Exam  Patient Vitals for the past 24 hrs:  BP Temp Pulse Resp SpO2 Height Weight  10/02/23 0253 131/76 -- -- -- -- -- --  10/02/23 0251 -- 98.6 F (37 C) 99 18 99 % 5\' 6"  (1.676 m) 71.7 kg   Constitutional: Well-developed, well-nourished female in no acute distress but very agitated, ruminating speech pattern.  .  Cardiovascular: normal rate  Respiratory: normal effort GI: Abd soft, non-tender, gravid appropriate for gestational age.   No rebound or guarding.   FHT:  147   Labs: No results found for this or any previous visit (from the past 24 hours).    Imaging:  Bedside Ultrasound done for reassurance Immediately on seeing image of baby, she gasped and said "it is  not normal!" Single fetus visualized Normal amniotic fluid Anterior placenta normal  Fetus active FHR 140s, regular, 4-chambered heart seen   MAU Course/MDM: I have reviewed the triage vital signs and the nursing notes.   Pertinent labs & imaging results that were available during my care of the patient were reviewed by me and considered in my medical decision making (see chart for details).      I have reviewed her medical records including past results, notes and treatments.   Unable to make any progress talking to patient Would not take any direction after ultrasound I repeatedly told her the baby looks well and details seen on the baby She insists the baby is abnormal and is dying Holding her Bible and turning to "Psalms 1" states "I need to see what to do"  I asked her what she wanted [place] to do and she repeatedly states "Can we just move on" and You need to give me the results of what is wrong with the baby"    "[name] is dying and you won't help me"  Offered to call family or someone to help her, and she states there is no one  Offered to take her to ED, and she agreed. [place] Tech took her by wheelchair to Main ED [employer] PA accepted her   Assessment: Single IUP at [redacted]w[redacted]d Cannaboid Hyperemesis Psychotic Episode,  Unclear diagnosis  Plan: Transferred to [facility] ED Allyne Gee PA accepting provider   Wynelle Bourgeois CNM, MSN Certified Nurse-Midwife 10/02/2023 2:59 AM

## 2023-10-02 NOTE — ED Notes (Signed)
 Pt stated "I don't want to bother anyone I just am going to leave" Security notified and at bedside. PA notified. Pt ran down to the [employer] and tried to leave. Security walked pt back to bed area.

## 2023-10-02 NOTE — MAU Note (Signed)
 Victorino Dike RN CN [facility] Main ED notified of pt being transferred via w/c to main ED. Lelon Perla PA accepted patient

## 2023-10-02 NOTE — ED Notes (Signed)
 Pt care taken, is talking to mom on the phone. Is calm and cooperative.

## 2023-10-03 DIAGNOSIS — O99342 Other mental disorders complicating pregnancy, second trimester: Secondary | ICD-10-CM | POA: Diagnosis not present

## 2023-10-03 NOTE — ED Notes (Signed)
 Pt has returned to room 49.

## 2023-10-03 NOTE — Progress Notes (Signed)
 Unable to discharge pt from system due to disposition error, charge RN aware.

## 2023-10-03 NOTE — ED Notes (Signed)
 Sheriffs department called for transport of pt. ETA given was 1100.

## 2023-10-03 NOTE — ED Notes (Signed)
 Pt left in care of sheriffs department in no new onset distress at this time.

## 2023-10-03 NOTE — ED Notes (Signed)
 Director of bussines [employer] at [phone]. After a brief conversation and leaving RN phone number, Kandis Nab stated an RN will call [facility] RN back.

## 2023-10-03 NOTE — ED Notes (Signed)
 RN called [facility] supervisor line at [phone]) in lieu of nurse pager line due to issues reaching nursing pager line (see previous note). Message left per paper guide to have an Biomedical scientist call primary RN back for report.

## 2023-10-03 NOTE — ED Notes (Signed)
 Pt had mild agitation after inquiring about plan of care for the day and hearing of likely transfer. Pt called parents and spoke to them for 5 minutes before returning to room.

## 2023-10-03 NOTE — ED Notes (Signed)
 Attempted to call [facility]  nurse pager line [phone]). When pressing 2 to leave a message per paper and recorded voice line, no opportunity was given line simply said "thank you", then ended the call.

## 2023-10-03 NOTE — ED Notes (Signed)
 Sam Raquel James R.N. at [facility] called to receive report on pt.

## 2023-10-03 NOTE — ED Notes (Addendum)
 Pt up to the bathroom, no complaints. Asked to get a book to read, given a copy of the bible.

## 2023-10-03 NOTE — ED Provider Notes (Signed)
 Emergency Medicine Observation Re-evaluation Note  Laura Lambert is a 28 y.o. female, seen on rounds today.  Pt initially presented to the ED for complaints of Medical Clearance Currently, the patient is showering.  Physical Exam  BP 128/73 (BP Location: Right Arm)   Pulse 67   Temp 98.2 F (36.8 C) (Oral)   Resp 18   Ht 5\' 6"  (1.676 m)   Wt 71.7 kg   SpO2 100%   BMI 25.50 kg/m  Physical Exam General: nad Cardiac: regular Lungs: no distress Psych: calm  ED Course / MDM  EKG:   I have reviewed the labs performed to date as well as medications administered while in observation.  Recent changes in the last 24 hours include none.  Plan  Current plan is for accepted to [facility] and leaving today.    Gwyneth Sprout, MD 10/03/23 [phone]

## 2023-10-03 NOTE — ED Notes (Signed)
 RN called by an Technical sales engineer white to discuss transport of pt. New eta is within 15 minutes providers notified.

## 2023-10-03 NOTE — ED Notes (Signed)
 IVC is current

## 2023-10-03 NOTE — ED Notes (Signed)
Pt is in the shower room.

## 2023-12-02 NOTE — Progress Notes (Unsigned)
 Psychiatric Initial Adult Assessment   Patient Identification: Laura Lambert MRN:  [id number] Date of Evaluation:  12/02/2023 Referral Source: *** Chief Complaint:  No chief complaint on file.  Visit Diagnosis: No diagnosis found.   Assessment:  Laura Lambert is a 28 y.o. female with a history of *** who presents in person to [facility] at [facility] for initial evaluation on 12/02/2023.    At initial evaluation patient reports ***  A number of assessments were performed during the evaluation today including  PHQ-9 which they scored a *** on, GAD-7 which they scored a *** on, and [place] suicide severity screening which showed ***.    Risk Assessment: A suicide and violence risk assessment was performed as part of this evaluation. There patient is deemed to be at chronic elevated risk for self-harm/suicide given the following factors: {SABSUICIDERISKFACTORS:29780}. These risk factors are mitigated by the following factors: {SABSUICIDEPROTECTIVEFACTORS:29779}. The patient is deemed to be at chronic elevated risk for violence given the following factors: {SABVIOLENCERISKFACTORS:29781}. These risk factors are mitigated by the following factors: {SABVIOLENCEPROTECTIVEFACTORS:29782}. There is no *** acute risk for suicide or violence at this time. The patient was educated about relevant modifiable risk factors including following recommendations for treatment of psychiatric illness and abstaining from substance abuse.  While future psychiatric events cannot be accurately predicted, the patient does not *** currently require  acute inpatient psychiatric care and does not *** currently meet [place]  involuntary commitment criteria.  Patient was given contact information for crisis resources, behavioral health clinic and was instructed to call 911 for emergencies.    Plan: # *** Past medication trials:  Status of problem: *** Interventions: -- ***  #  *** Past medication trials:  Status of problem: *** Interventions: -- ***  # *** Past medication trials:  Status of problem: *** Interventions: -- ***   History of Present Illness:  ***   28 year old female with history of borderline personality disorder, anxiety, insomnia, depression, presenting to the ED from [facility] for psychiatric evaluation.  Apparently presented there to check on her baby.  She was observed to have bizarre behavior with provider.  She did have ultrasound which was reassuring and when given this news she accused the provider of lying to her.  She tells me and nurse that "that was not my baby on the screen, she was making that up."  She states she feels like no one is helping her.  She is reading bible intermittently.  She states she has not slept in about 5 days.  She is minimally eating-- applesauce packets and pieces of bagels.  Admits to some marijuana  use, denies other drug use.  She is only taking promethazine currently.  N   Associated Signs/Symptoms: Depression Symptoms:  {DEPRESSION SYMPTOMS:20000} (Hypo) Manic Symptoms:  {BHH MANIC SYMPTOMS:22872} Anxiety Symptoms:  {BHH ANXIETY SYMPTOMS:22873} Psychotic Symptoms:  {BHH PSYCHOTIC SYMPTOMS:22874} PTSD Symptoms: {BHH PTSD SYMPTOMS:22875}  Past Psychiatric History:  Past psychiatric diagnoses: ***History of depression, anxiety, and borderline personality disorder.  Psychiatric hospitalizations:Has had 6 past hospitalizations the last at [facility] in April of 2025 Past suicide attempts: ***In the sixth grade she was being bullied at school and attempted to drown herself in the bathtub. Has tried to overdose on Klonopin and trazodone  in the past Hx of self harm: ***Reports that at 28 years old she had cut her wrist while she was in the bathtub because she got caught shoplifting  Hx of violence towards others: *** Prior psychiatric providers: ***  Prior therapy: *** Access to firearms: ***  Prior medication  trials: ***Abilify , mirtazapine, zoloft , trazodone , propranolol  Substance use: ***  Past Medical History:  Past Medical History:  Diagnosis Date   Anxiety    Depression     Past Surgical History:  Procedure Laterality Date   DENTAL SURGERY      Family Psychiatric History: ***Grandmother attempted suicide  Family History:  Family History  Problem Relation Age of Onset   Cancer Other    Hyperlipidemia Other    Hypertension Other    Diabetes Other    Heart attack Other    Hypertension Father    Depression Maternal Grandmother     Social History:   Social History   Socioeconomic History   Marital status: Single    Spouse name: Not on file   Number of children: Not on file   Years of education: Not on file   Highest education level: Not on file  Occupational History   Not on file  Tobacco Use   Smoking status: Never   Smokeless tobacco: Never  Vaping Use   Vaping status: Never Used  Substance and Sexual Activity   Alcohol use: Never    Comment: Occ   Drug use: Never   Sexual activity: Yes    Birth control/protection: None  Other Topics Concern   Not on file  Social History Narrative   Not on file   Social Drivers of Health   Financial Resource Strain: Not on file  Food Insecurity: Not on file  Transportation Needs: Not on file  Physical Activity: Not on file  Stress: Not on file  Social Connections: Unknown (11/07/2021)   [facility]   Social Network    Social Network: Not on file    Additional Social History: *** Unemployed, pregnant, lives with parents   Allergies:   Allergies  Allergen Reactions   Latex Itching    Metabolic Disorder Labs: No results found for: "HGBA1C", "MPG" Lab Results  Component Value Date   PROLACTIN 5.3 02/27/2020   Lab Results  Component Value Date   CHOL 180 02/27/2020   TRIG 65 02/27/2020   HDL 49 02/27/2020   CHOLHDL 3.7 02/27/2020   VLDL 13 02/27/2020   LDLCALC 118 (H) 02/27/2020    Lab Results  Component Value Date   TSH 7.015 (H) 02/27/2020    Therapeutic Level Labs: No results found for: "LITHIUM" No results found for: "CBMZ" No results found for: "VALPROATE"  Current Medications: Current Outpatient Medications  Medication Sig Dispense Refill   Prenatal Vit-Fe Fumarate-FA (PRENATAL MULTIVITAMIN) TABS tablet Take 1 tablet by mouth daily at 12 noon. 30 tablet 0   promethazine (PHENERGAN) 25 MG tablet Take 25 mg by mouth every 6 (six) hours as needed for nausea or vomiting.     UNKNOWN TO PATIENT Take 2 tablets by mouth daily as needed (pain,headache). Unknown OTC pain reliever     No current facility-administered medications for this visit.    Musculoskeletal: Strength & Muscle Tone: {desc; muscle tone:32375} Gait & Station: {PE GAIT ED NFAO:13086} Patient leans: {Patient Leans:21022755}  Psychiatric Specialty Exam:  Psychiatric Specialty Exam: unknown if currently breastfeeding.There is no height or weight on file to calculate BMI. Review of Systems  General Appearance: {Appearance:22683}  Eye Contact:  {BHH EYE CONTACT:22684}  Speech:  {Speech:22685}  Volume:  {Volume (PAA):22686}  Mood:  {BHH MOOD:22306}  Affect:  {Affect (PAA):22687}  Thought Content: {Thought Content:22690}   Suicidal Thoughts:  {ST/HT (PAA):22692}  Homicidal Thoughts:  {ST/HT (PAA):22692}  Thought Process:  {Thought Process (PAA):22688}  Orientation:  {BHH ORIENTATION (PAA):22689}    Memory: {BHH MEMORY:22881}  Judgment:  {Judgement (PAA):22694}  Insight:  {Insight (PAA):22695}  Concentration:  {Concentration:21399}  Recall:  not formally assessed ***  Fund of Knowledge: {BHH GOOD/FAIR/POOR:22877}  Language: {BHH GOOD/FAIR/POOR:22877}  Psychomotor Activity:  {Psychomotor (PAA):22696}  Akathisia:  {BHH YES OR NO:22294}  AIMS (if indicated): {Desc; done/not:10129}  Assets:  {Assets (PAA):22698}  ADL's:  {BHH BJY'N:82956}  Cognition: {chl bhh cognition:304700322}   Sleep:  {BHH GOOD/FAIR/POOR:22877}    Screenings: AIMS    Flowsheet Row [facility]) [facility] Ad[facility]Dis[facility]  AIMS Total Score 0 0      AUDIT    Flowsheet Row [facility]) [facility] Ad[facility] (Dis[facility]  Alcohol Use Disorder Identification Test Final Score (AUDIT) 0 1      PHQ2-9    Flowsheet Row Office Visit from 09/05/2016 in Primary Care at [facility] Total Score 0      Flowsheet Row ED from 10/02/2023 in [facility] at [facility] Admission (Discharged) [facility] ED from 03[facility]14/2020 in [facility]  C-SSRS RISK CATEGORY No Risk High Risk High Risk        Collaboration of Care: [facility] OP Collaboration of Care:[id number]}  Patient/Guardian was advised Release of Information must be obtained prior to any record release in order to collaborate their care with an outside provider. Patient/Guardian was advised if they have not already done so to contact the registration department to sign all necessary forms in order for us  to release information regarding their care.   Consent: Patient/Guardian gives verbal consent for treatment and assignment of benefits for services provided during this visit. Patient/Guardian expressed understanding and agreed to proceed.   Yves Herb, MD 6/9/202510:13 AM

## 2023-12-03 ENCOUNTER — Encounter (HOSPITAL_COMMUNITY): Payer: Self-pay | Admitting: Psychiatry

## 2023-12-03 ENCOUNTER — Ambulatory Visit (HOSPITAL_BASED_OUTPATIENT_CLINIC_OR_DEPARTMENT_OTHER): Payer: Self-pay | Admitting: Psychiatry

## 2023-12-03 DIAGNOSIS — F603 Borderline personality disorder: Secondary | ICD-10-CM

## 2023-12-03 DIAGNOSIS — F19959 Other psychoactive substance use, unspecified with psychoactive substance-induced psychotic disorder, unspecified: Secondary | ICD-10-CM | POA: Diagnosis not present

## 2023-12-03 MED ORDER — SERTRALINE HCL 100 MG PO TABS: 100.0000 mg | ORAL_TABLET | Freq: Every day | ORAL | 2 refills | Status: AC

## 2024-02-03 NOTE — Progress Notes (Deleted)
 BH MD/PA/NP OP Progress Note  02/03/2024 1:00 PM Laura Lambert  MRN:  [id number]  Visit Diagnosis: No diagnosis found.  Assessment: Laura Lambert is a 28 y.o. female with a history of borderline personality disorder, substance-induced psychosis, and marijuana use disorder who presented to [facility] at [facility] for initial evaluation on 12/03/2023.    At initial evaluation patient reported a history of substance-induced psychosis that is currently stable.  Patient has experienced delusions of paranoia in the context of marijuana use that tend to resolve with the combination of substance cessation and antipsychotic medication.  Last episode was in April 2025 for which patient was hospitalized.  Since hospital discharge she has had no further paranoia or delusions despite not being on any antipsychotic medication.  She was well organized, able to present a linear history, and displays no signs of internal preoccupation.  Patient has joined [employer] since her hospital discharge in 2025.  In addition to the substance-induced psychosis patient also has a history of borderline personality disorder for which she is in ongoing therapy and DBT group therapy.  Patient met criteria for substance-induced psychosis currently in remission and borderline personality disorder on initial evaluation.    As she is currently stable on her medication regimen of sertraline  and well-connected with therapy would be appropriate to continue with that at this time.  Of note she is [redacted] weeks pregnant and risk/benefits of Zoloft  were reviewed.  Patient will follow up in 2 months.  Laura Lambert presents for follow-up evaluation. Today, 02/03/24, patient reports ***  Risk Assessment: An assessment of suicide and violence risk factors was performed as part of this evaluation and is not significantly changed from the last visit. While future psychiatric events cannot be  accurately predicted, the patient does not currently require acute inpatient psychiatric care and does not currently meet [place]  involuntary commitment criteria. Patient was given contact information for crisis resources, behavioral health clinic and was instructed to call 911 for emergencies.   Plan: # BPD Past medication trials: Zoloft , Prozac, propranolol, mirtazapine, and trazodone  Status of problem: Ongoing Interventions: - Continue Zoloft  100 mg daily - Discontinue mirtazapine due to no longer needing it - Continue DBT group through [facility] in [place] - Continue individual therapy with Laura Lambert  # Substance-induced psychosis Past medication trials: Haldol  (akathisia), Abilify , Seroquel Status of problem: In remission Interventions: - Continue marijuana Anonymous - Recommended continued abstinence    Chief Complaint: No chief complaint on file.  HPI: ***   Past Psychiatric History:  Past psychiatric diagnoses: History of depression, anxiety, and borderline personality disorder, and marijuana induced psychosis Psychiatric hospitalizations:Has had 6 past hospitalizations the last at [facility] in April of 2025 in the context of delusions secondary to marijuana use Past suicide attempts: In the sixth grade she was being bullied at school and attempted to drown herself in the bathtub. Has tried to overdose on Klonopin and trazodone  in the past around 2021. Hx of self harm: Reports that at 28 years old she had cut her wrist while she was in the bathtub because she got caught shoplifting  Hx of violence towards others: Denies Prior psychiatric providers: Was at [facility] in [facility] past, [facility] prior to that.  [place] after her discharge from [facility] however found the distance to be difficult to consistently maintain. Prior therapy: In DBT therapy in [place] and marijuana anonymous, and individual therapy with  Laura Lambert   Access to firearms: No  Prior medication trials: Abilify , Haldol  (akithisia), Seroquel (difficulty coming off), mirtazapine (took as needed for insomnia with good effect has not needed recently), zoloft , trazodone  (dependency), propranolol  Substance use: Has intermittently used marijuana, used acid and mushrooms a couple times in college. Had used alcohol a bit more heavily prior to her pregnancy.  She believes this was in a way as a coping mechanism to manage mood lability symptoms.  She discontinued after getting pregnant and has since developed better insight into the adverse effects of alcohol.  Patient does not plan to restart after her pregnancy.  Past Medical History:  Past Medical History:  Diagnosis Date   Anxiety    Depression     Past Surgical History:  Procedure Laterality Date   DENTAL SURGERY     Family History:  Family History  Problem Relation Age of Onset   Cancer Other    Hyperlipidemia Other    Hypertension Other    Diabetes Other    Heart attack Other    Hypertension Father    Depression Maternal Grandmother     Social History:  Social History   Socioeconomic History   Marital status: Single    Spouse name: Not on file   Number of children: Not on file   Years of education: Not on file   Highest education level: Not on file  Occupational History   Not on file  Tobacco Use   Smoking status: Never   Smokeless tobacco: Never  Vaping Use   Vaping status: Never Used  Substance and Sexual Activity   Alcohol use: Never    Comment: Occ   Drug use: Never   Sexual activity: Yes    Birth control/protection: None  Other Topics Concern   Not on file  Social History Narrative   Not on file   Social Drivers of Health   Financial Resource Strain: Not on file  Food Insecurity: Not on file  Transportation Needs: Not on file  Physical Activity: Not on file  Stress: Not on file  Social Connections: Unknown (11/07/2021)   Received from  [facility]   Social Network    Social Network: Not on file    Allergies:  Allergies  Allergen Reactions   Latex Itching    Current Medications: Current Outpatient Medications  Medication Sig Dispense Refill   Prenatal Vit-Fe Fumarate-FA (PRENATAL MULTIVITAMIN) TABS tablet Take 1 tablet by mouth daily at 12 noon. 30 tablet 0   promethazine (PHENERGAN) 25 MG tablet Take 25 mg by mouth every 6 (six) hours as needed for nausea or vomiting.     sertraline  (ZOLOFT ) 100 MG tablet Take 1 tablet (100 mg total) by mouth daily. 30 tablet 2   UNKNOWN TO PATIENT Take 2 tablets by mouth daily as needed (pain,headache). Unknown OTC pain reliever     No current facility-administered medications for this visit.     Musculoskeletal: Strength & Muscle Tone: {desc; muscle tone:32375} Gait & Station: {PE GAIT ED WJUO:77474} Patient leans: {Patient Leans:21022755}  Psychiatric Specialty Exam: unknown if currently breastfeeding.There is no height or weight on file to calculate BMI. Review of Systems  General Appearance: {Appearance:22683}  Eye Contact:  {BHH EYE CONTACT:22684}  Speech:  {Speech:22685}  Volume:  {Volume (PAA):22686}  Mood:  {BHH MOOD:22306}  Affect:  {Affect (PAA):22687}  Thought Content: {Thought Content:22690}   Suicidal Thoughts:  {ST/HT (PAA):22692}  Homicidal Thoughts:  {ST/HT (PAA):22692}  Thought Process:  {Thought Process (PAA):22688}  Orientation:  {BHH ORIENTATION (PAA):22689}    Memory: {BHH FZFNMB:77118}  Judgment:  {Judgement (PAA):22694}  Insight:  {Insight (PAA):22695}  Concentration:  {Concentration:21399}  Recall:  not formally assessed ***  Fund of Knowledge: {BHH GOOD/FAIR/POOR:22877}  Language: {BHH GOOD/FAIR/POOR:22877}  Psychomotor Activity:  {Psychomotor (PAA):22696}  Akathisia:  {BHH YES OR NO:22294}  AIMS (if indicated): {Desc; done/not:10129}  Assets:  {Assets (PAA):22698}  ADL's:  {BHH JIO'D:77709}  Cognition: {chl bhh cognition:304700322}   Sleep:  {BHH GOOD/FAIR/POOR:22877}   Metabolic Disorder Labs: No results found for: HGBA1C, MPG Lab Results  Component Value Date   PROLACTIN 5.3 02/27/2020   Lab Results  Component Value Date   CHOL 180 02/27/2020   TRIG 65 02/27/2020   HDL 49 02/27/2020   CHOLHDL 3.7 02/27/2020   VLDL 13 02/27/2020   LDLCALC 118 (H) 02/27/2020   Lab Results  Component Value Date   TSH 7.015 (H) 02/27/2020   TSH 1.385 ***Test methodology is 3rd generation TSH*** 11/30/2008    Therapeutic Level Labs: No results found for: LITHIUM No results found for: VALPROATE No results found for: CBMZ   Screenings: AIMS    Flowsheet Row [facility] Admission (Dis[facility]) [facility]  AIMS Total Score 0 0   AUDIT    Flowsheet Row Ad[facility] ([facility]) [facility] Admission (Discharged) [facility]  Alcohol Use Disorder Identification Test Final Score (AUDIT) 0 1   GAD-7    Flowsheet Row [facility]  Total GAD-7 Score 2   PHQ2-9    Flowsheet Row [facility] 12[facility]03/2023 [facility] Office Visit from 09/05/2016 in [facility] Care at [facility] Total Score 0 0   Flowsheet Row Office [facility] 12/[facility]2023 [facility] ED from 10/02/2023 in [facility] Emergency Department at [facility] Admission (Discharged) [facility]  C-SSRS RISK CATEGORY Moderate Risk No Risk High Risk    Collaboration of Care: Collaboration of Care: {BH OP Collaboration of Care:21014065}  Patient/Guardian was advised Release of Information must be obtained prior to any  record release in order to collaborate their care with an outside provider. Patient/Guardian was advised if they have not already done so to contact the registration department to sign all necessary forms in order for us  to release information regarding their care.   Consent: Patient/Guardian gives verbal consent for treatment and assignment of benefits for services provided during this visit. Patient/Guardian expressed understanding and agreed to proceed.    Laura CHRISTELLA Finder, MD 02/03/2024, 1:00 PM

## 2024-02-04 ENCOUNTER — Ambulatory Visit (HOSPITAL_COMMUNITY): Admitting: Psychiatry
